# Patient Record
Sex: Female | Born: 1965 | Race: White | Hispanic: No | Marital: Married | Smoking: Never smoker
Health system: Southern US, Community
[De-identification: ages and names within clinical notes are randomized; demographics above are authoritative.]

## PROBLEM LIST (undated history)

## (undated) DIAGNOSIS — F32A Depression, unspecified: Secondary | ICD-10-CM

## (undated) DIAGNOSIS — K219 Gastro-esophageal reflux disease without esophagitis: Secondary | ICD-10-CM

## (undated) DIAGNOSIS — R32 Unspecified urinary incontinence: Secondary | ICD-10-CM

## (undated) DIAGNOSIS — S43006A Unspecified dislocation of unspecified shoulder joint, initial encounter: Secondary | ICD-10-CM

## (undated) DIAGNOSIS — J309 Allergic rhinitis, unspecified: Secondary | ICD-10-CM

## (undated) DIAGNOSIS — F329 Major depressive disorder, single episode, unspecified: Secondary | ICD-10-CM

## (undated) HISTORY — DX: Unspecified urinary incontinence: R32

## (undated) HISTORY — DX: Allergic rhinitis, unspecified: J30.9

## (undated) HISTORY — DX: Major depressive disorder, single episode, unspecified: F32.9

## (undated) HISTORY — DX: Unspecified dislocation of unspecified shoulder joint, initial encounter: S43.006A

## (undated) HISTORY — DX: Gastro-esophageal reflux disease without esophagitis: K21.9

## (undated) HISTORY — PX: APPENDECTOMY: SHX54

## (undated) HISTORY — DX: Depression, unspecified: F32.A

---

## 2005-03-02 ENCOUNTER — Other Ambulatory Visit: Admission: RE | Admit: 2005-03-02 | Discharge: 2005-03-02 | Payer: Self-pay | Admitting: Family Medicine

## 2005-04-01 ENCOUNTER — Encounter: Admission: RE | Admit: 2005-04-01 | Discharge: 2005-04-01 | Payer: Self-pay | Admitting: Family Medicine

## 2005-06-14 DIAGNOSIS — S43006A Unspecified dislocation of unspecified shoulder joint, initial encounter: Secondary | ICD-10-CM

## 2005-06-14 HISTORY — DX: Unspecified dislocation of unspecified shoulder joint, initial encounter: S43.006A

## 2005-06-14 HISTORY — PX: ELBOW SURGERY: SHX618

## 2006-01-25 ENCOUNTER — Ambulatory Visit: Payer: Self-pay | Admitting: Family Medicine

## 2006-03-01 ENCOUNTER — Encounter: Admission: RE | Admit: 2006-03-01 | Discharge: 2006-03-01 | Payer: Self-pay | Admitting: Family Medicine

## 2006-07-18 ENCOUNTER — Ambulatory Visit: Payer: Self-pay | Admitting: Family Medicine

## 2006-07-26 ENCOUNTER — Ambulatory Visit: Payer: Self-pay | Admitting: Family Medicine

## 2006-08-11 ENCOUNTER — Ambulatory Visit: Payer: Self-pay | Admitting: Family Medicine

## 2006-08-11 ENCOUNTER — Other Ambulatory Visit: Admission: RE | Admit: 2006-08-11 | Discharge: 2006-08-11 | Payer: Self-pay | Admitting: Family Medicine

## 2006-09-13 ENCOUNTER — Encounter: Admission: RE | Admit: 2006-09-13 | Discharge: 2006-09-13 | Payer: Self-pay | Admitting: Family Medicine

## 2006-09-29 ENCOUNTER — Ambulatory Visit: Payer: Self-pay | Admitting: Family Medicine

## 2007-04-20 ENCOUNTER — Ambulatory Visit: Payer: Self-pay | Admitting: Family Medicine

## 2007-05-24 ENCOUNTER — Ambulatory Visit: Payer: Self-pay | Admitting: Family Medicine

## 2007-08-11 ENCOUNTER — Ambulatory Visit: Payer: Self-pay | Admitting: Family Medicine

## 2007-08-11 ENCOUNTER — Other Ambulatory Visit: Admission: RE | Admit: 2007-08-11 | Discharge: 2007-08-11 | Payer: Self-pay | Admitting: Family Medicine

## 2008-02-29 IMAGING — CR DG HIP (WITH OR WITHOUT PELVIS) 2-3V*L*
2 series · 2 of 2 positions shown · non-contrast
Comparison: none

CLINICAL DATA: Left groin pain.  No injury.
 LEFT HIP ? 2 VIEW:

[view not recorded (1 of 2)]
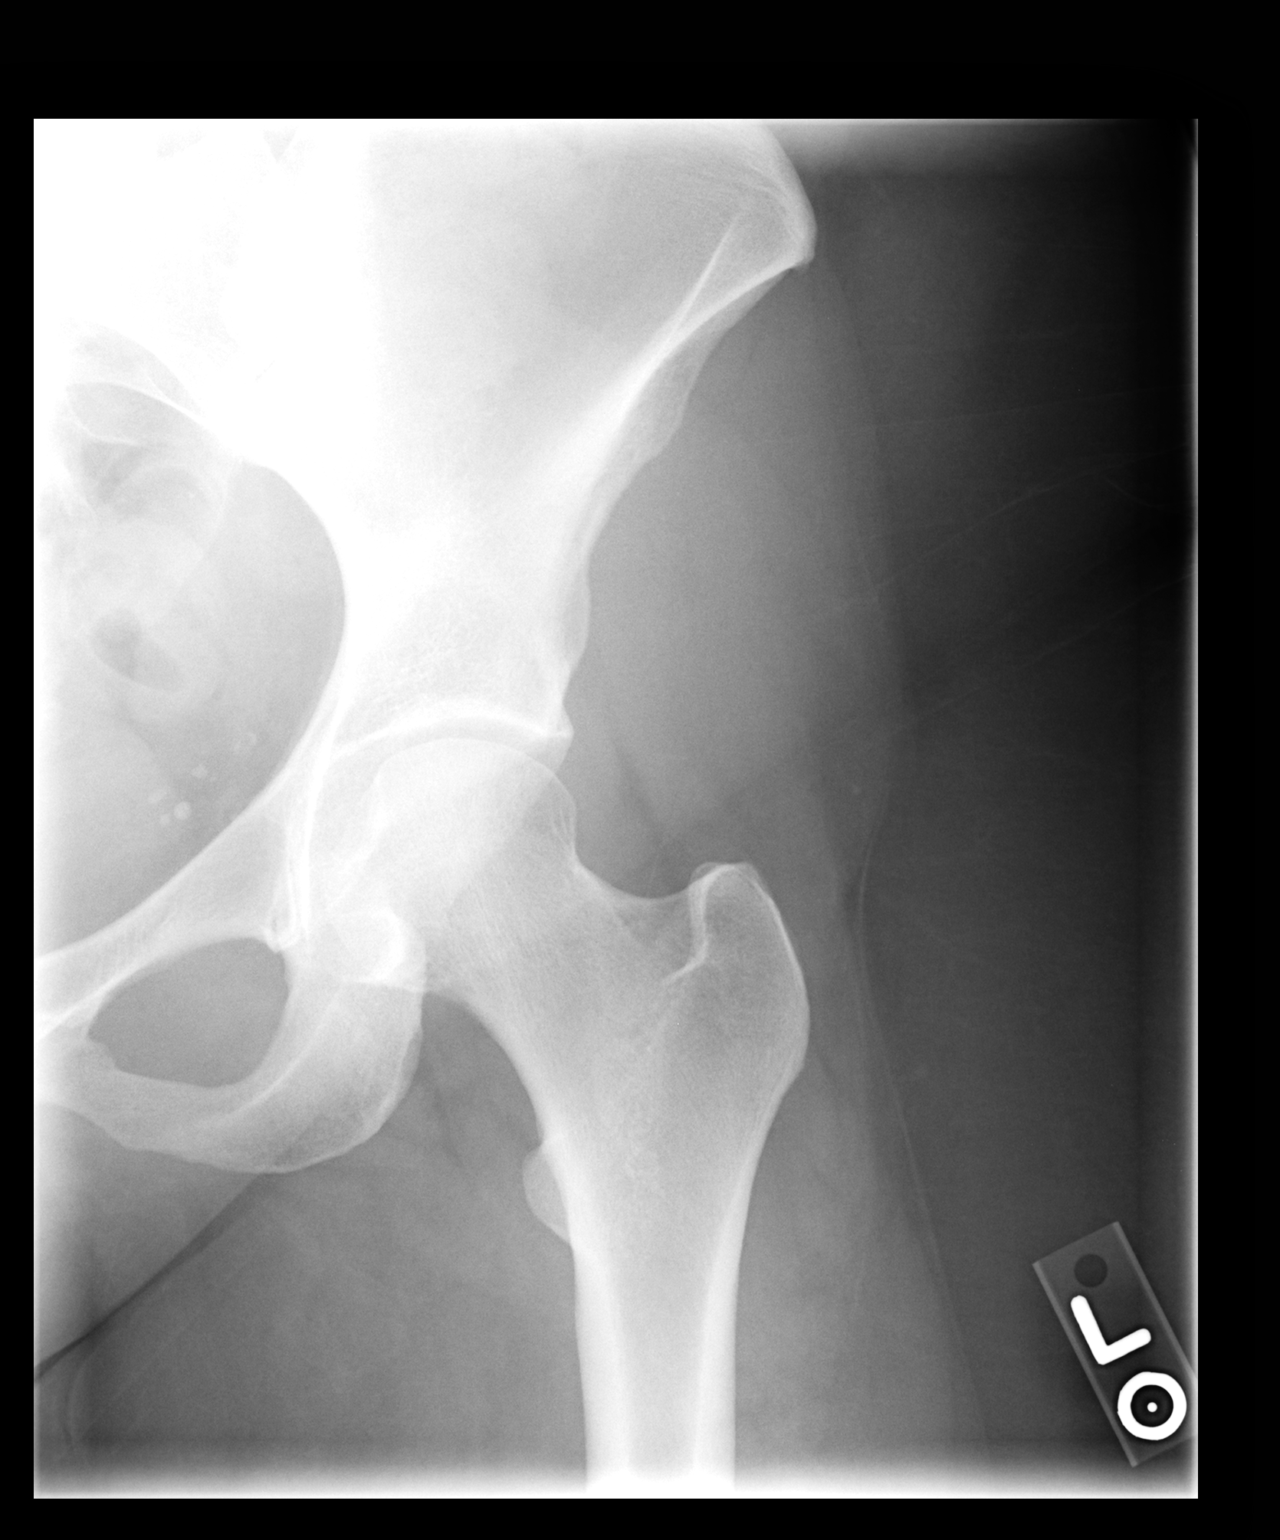

[view not recorded (2 of 2)]
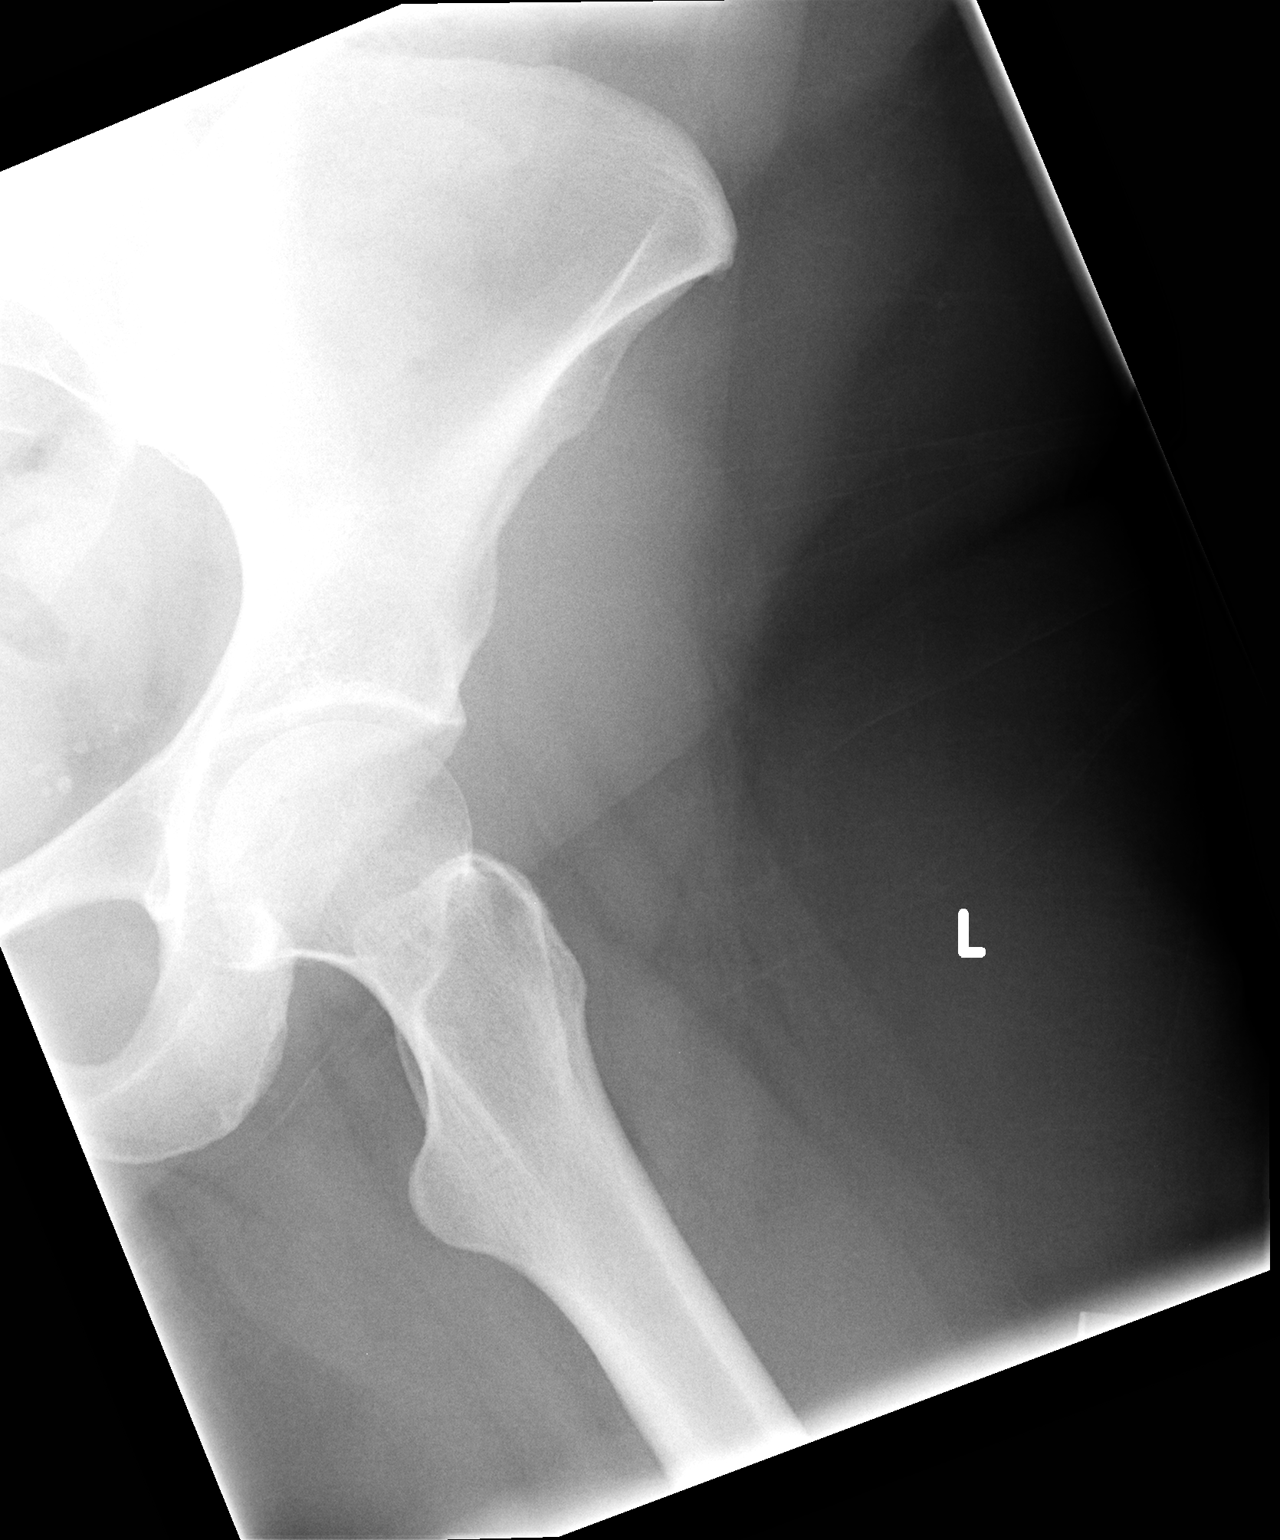

[2 of 2 positions shown; findings below may reference images not displayed]

FINDINGS: Two views of the left hip were obtained.  The left hip joint space appears normal.  No degenerative change is seen and no acute abnormality is noted.
IMPRESSION: Negative left hip.

## 2008-03-18 ENCOUNTER — Ambulatory Visit: Payer: Self-pay | Admitting: Family Medicine

## 2013-04-02 ENCOUNTER — Ambulatory Visit: Payer: Self-pay | Admitting: Neurology

## 2013-11-12 ENCOUNTER — Ambulatory Visit (INDEPENDENT_AMBULATORY_CARE_PROVIDER_SITE_OTHER): Payer: Self-pay | Admitting: Family Medicine

## 2013-11-12 ENCOUNTER — Ambulatory Visit: Payer: Self-pay | Admitting: Family Medicine

## 2013-11-12 ENCOUNTER — Encounter: Payer: Self-pay | Admitting: Family Medicine

## 2013-11-12 ENCOUNTER — Telehealth: Payer: Self-pay

## 2013-11-12 VITALS — BP 118/82 | HR 98 | Temp 98.4°F | Ht 63.5 in | Wt 196.0 lb

## 2013-11-12 DIAGNOSIS — M791 Myalgia, unspecified site: Secondary | ICD-10-CM

## 2013-11-12 DIAGNOSIS — R202 Paresthesia of skin: Secondary | ICD-10-CM

## 2013-11-12 DIAGNOSIS — E669 Obesity, unspecified: Secondary | ICD-10-CM | POA: Insufficient documentation

## 2013-11-12 DIAGNOSIS — M25519 Pain in unspecified shoulder: Secondary | ICD-10-CM

## 2013-11-12 DIAGNOSIS — R200 Anesthesia of skin: Secondary | ICD-10-CM

## 2013-11-12 DIAGNOSIS — R209 Unspecified disturbances of skin sensation: Secondary | ICD-10-CM

## 2013-11-12 DIAGNOSIS — IMO0001 Reserved for inherently not codable concepts without codable children: Secondary | ICD-10-CM

## 2013-11-12 DIAGNOSIS — M25512 Pain in left shoulder: Secondary | ICD-10-CM

## 2013-11-12 NOTE — Telephone Encounter (Signed)
Patient called to return your phone call °

## 2013-11-12 NOTE — Telephone Encounter (Signed)
Left message for call back Identifiable   New patient 

## 2013-11-12 NOTE — Progress Notes (Signed)
Subjective:    Patient ID: Cindy Stephens, female    DOB: September 09, 1965, 48 y.o.   MRN: [id number]  HPI Pt here to establish and c/o L shoulder pain and numbness in R arm and legs.  April 20 she had an episode of dizziness and she laid down and lifted her arm up an felt a pop in L shoulder.  Swelling went away but she she still has a nagging discomfort in L shoulder.  And she has hx of dislocation year s ago.  No chest pain. Sob .  When she lays down at night her L shoulder hurts.  Pt also c/o numb left thigh.  No back problems.     Review of Systems As above  Past Medical History  Diagnosis Date  . Shoulder dislocation 2007    Left Shoulder  . Depression   . GERD (gastroesophageal reflux disease)   . Allergic rhinitis   . Urinary incontinence    History   Social History  . Marital Status: Married    Spouse Name: N/A    Number of Children: N/A  . Years of Education: N/A   Occupational History  . Not on file.   Social History Main Topics  . Smoking status: Never Smoker   . Smokeless tobacco: Never Used  . Alcohol Use: Yes     Comment: Occ  . Drug Use: No  . Sexual Activity: Yes    Partners: Male   Other Topics Concern  . Not on file   Social History Narrative  . No narrative on file   Current Outpatient Prescriptions  Medication Sig Dispense Refill  . aspirin 81 MG tablet Take 81 mg by mouth daily.      Marland Kitchen co-enzyme Q-10 30 MG capsule Take 30 mg by mouth 3 (three) times daily.      Marland Kitchen DHEA 10 MG TABS Take by mouth.      . fluticasone (FLONASE) 50 MCG/ACT nasal spray Place into both nostrils daily.      Marland Kitchen glucosamine-chondroitin 500-400 MG tablet Take 1 tablet by mouth 2 (two) times daily.      Marland Kitchen loratadine (CLARITIN) 10 MG tablet Take 10 mg by mouth daily.      . Melatonin 10 MG CAPS Take by mouth.      . Misc Natural Products (ESTROVEN ENERGY) TABS Take 1 tablet by mouth 2 (two) times daily.      . montelukast (SINGULAIR) 10 MG tablet Take 10 mg by mouth at bedtime.       . Omega 3-6-9 Fatty Acids (OMEGA 3-6-9 COMPLEX PO) Take by mouth.      . Omega-3 Fatty Acids (FISH OIL) 1000 MG CAPS Take 1 capsule by mouth.      Marland Kitchen omeprazole (PRILOSEC OTC) 20 MG tablet Take 20 mg by mouth daily.      Marland Kitchen Phenylephrine-DM-GG-APAP (MUCINEX FAST-MAX) 10-20-400-650 MG PACK Take by mouth.      . pseudoephedrine (SUDAFED) 30 MG tablet Take 30 mg by mouth every 4 (four) hours as needed for congestion.      . vitamin B-12 (CYANOCOBALAMIN) 1000 MCG tablet Take 1,000 mcg by mouth daily.       No current facility-administered medications for this visit.       Objective:   Physical Exam        Assessment & Plan:  1. Numbness and tingling Check labs , consider neuro secondary to multiple limb involvment - Basic metabolic panel - CBC with Differential -  Hepatic function panel - POCT urinalysis dipstick - TSH - Vitamin B12 - Vitamin D 1,25 dihydroxy - ANA - Sedimentation rate - Rheumatoid factor - AMB referral to sports medicine - T3, free - T4, free - Thyroid antibodies  2. Myalgia Check labs--hx vita d def - Basic metabolic panel - CBC with Differential - Hepatic function panel - POCT urinalysis dipstick - TSH - Vitamin B12 - Vitamin D 1,25 dihydroxy - ANA - Sedimentation rate - Rheumatoid factor - T3, free - T4, free - Thyroid antibodies  3. Left shoulder pain ekg nsr Refer to sports med for eval  - EKG 12-Lead - AMB referral to sports medicine - T3, free - T4, free - Thyroid antibodies

## 2013-11-12 NOTE — Progress Notes (Signed)
Pre visit review using our clinic review tool, if applicable. No additional management support is needed unless otherwise documented below in the visit note. 

## 2013-11-12 NOTE — Telephone Encounter (Signed)
Unable to reach pre visit.  

## 2013-11-12 NOTE — Patient Instructions (Signed)
   Shoulder Pain The shoulder is the joint that connects your arms to your body. The bones that form the shoulder joint include the upper arm bone (humerus), the shoulder blade (scapula), and the collarbone (clavicle). The top of the humerus is shaped like a ball and fits into a rather flat socket on the scapula (glenoid cavity). A combination of muscles and strong, fibrous tissues that connect muscles to bones (tendons) support your shoulder joint and hold the ball in the socket. Small, fluid-filled sacs (bursae) are located in different areas of the joint. They act as cushions between the bones and the overlying soft tissues and help reduce friction between the gliding tendons and the bone as you move your arm. Your shoulder joint allows a wide range of motion in your arm. This range of motion allows you to do things like scratch your back or throw a ball. However, this range of motion also makes your shoulder more prone to pain from overuse and injury. Causes of shoulder pain can originate from both injury and overuse and usually can be grouped in the following four categories:  Redness, swelling, and pain (inflammation) of the tendon (tendinitis) or the bursae (bursitis).  Instability, such as a dislocation of the joint.  Inflammation of the joint (arthritis).  Broken bone (fracture). HOME CARE INSTRUCTIONS   Apply ice to the sore area.  Put ice in a plastic bag.  Place a towel between your skin and the bag.  Leave the ice on for 15-20 minutes, 03-04 times per day for the first 2 days.  Stop using cold packs if they do not help with the pain.  If you have a shoulder sling or immobilizer, wear it as long as your caregiver instructs. Only remove it to shower or bathe. Move your arm as little as possible, but keep your hand moving to prevent swelling.  Squeeze a soft ball or foam pad as much as possible to help prevent swelling.  Only take over-the-counter or prescription medicines for  pain, discomfort, or fever as directed by your caregiver. SEEK MEDICAL CARE IF:   Your shoulder pain increases, or new pain develops in your arm, hand, or fingers.  Your hand or fingers become cold and numb.  Your pain is not relieved with medicines. SEEK IMMEDIATE MEDICAL CARE IF:   Your arm, hand, or fingers are numb or tingling.  Your arm, hand, or fingers are significantly swollen or turn white or blue. MAKE SURE YOU:   Understand these instructions.  Will watch your condition.  Will get help right away if you are not doing well or get worse. Document Released: 03/10/2005 Document Revised: 02/23/2012 Document Reviewed: 05/15/2011 [employer] Patient Information 2014 [employer].  

## 2013-11-13 ENCOUNTER — Other Ambulatory Visit (INDEPENDENT_AMBULATORY_CARE_PROVIDER_SITE_OTHER): Payer: Self-pay

## 2013-11-13 DIAGNOSIS — Z0000 Encounter for general adult medical examination without abnormal findings: Secondary | ICD-10-CM

## 2013-11-13 LAB — HEPATIC FUNCTION PANEL
ALT: 25 U/L (ref 0–35)
AST: 24 U/L (ref 0–37)
Albumin: 4 g/dL (ref 3.5–5.2)
Alkaline Phosphatase: 55 U/L (ref 39–117)
Bilirubin, Direct: 0 mg/dL (ref 0.0–0.3)
Total Bilirubin: 0.8 mg/dL (ref 0.2–1.2)
Total Protein: 6.9 g/dL (ref 6.0–8.3)

## 2013-11-13 LAB — CBC WITH DIFFERENTIAL/PLATELET
Basophils Absolute: 0 10*3/uL (ref 0.0–0.1)
Basophils Relative: 0.6 % (ref 0.0–3.0)
Eosinophils Absolute: 0.1 10*3/uL (ref 0.0–0.7)
Eosinophils Relative: 2.6 % (ref 0.0–5.0)
HCT: 36.4 % (ref 36.0–46.0)
Hemoglobin: 12.6 g/dL (ref 12.0–15.0)
Lymphocytes Relative: 33.9 % (ref 12.0–46.0)
Lymphs Abs: 1.4 10*3/uL (ref 0.7–4.0)
MCHC: 34.6 g/dL (ref 30.0–36.0)
MCV: 89.5 fl (ref 78.0–100.0)
Monocytes Absolute: 0.3 10*3/uL (ref 0.1–1.0)
Monocytes Relative: 7.7 % (ref 3.0–12.0)
Neutro Abs: 2.2 10*3/uL (ref 1.4–7.7)
Neutrophils Relative %: 55.2 % (ref 43.0–77.0)
Platelets: 188 10*3/uL (ref 150.0–400.0)
RBC: 4.06 Mil/uL (ref 3.87–5.11)
RDW: 13.2 % (ref 11.5–15.5)
WBC: 4 10*3/uL (ref 4.0–10.5)

## 2013-11-13 LAB — BASIC METABOLIC PANEL
BUN: 15 mg/dL (ref 6–23)
CO2: 25 mEq/L (ref 19–32)
Calcium: 9.1 mg/dL (ref 8.4–10.5)
Chloride: 103 mEq/L (ref 96–112)
Creatinine, Ser: 0.7 mg/dL (ref 0.4–1.2)
GFR: 99.48 mL/min (ref >60.00)
Glucose, Bld: 103 mg/dL — ABNORMAL HIGH (ref 70–99)
Potassium: 3.6 mEq/L (ref 3.5–5.1)
Sodium: 136 mEq/L (ref 135–145)

## 2013-11-13 LAB — ANTI-NUCLEAR AB-TITER (ANA TITER): ANA Titer 1: 1:80 {titer} — ABNORMAL HIGH

## 2013-11-13 LAB — THYROID ANTIBODIES
Thyroglobulin Ab: 26.6 IU/mL (ref <40.0)
Thyroperoxidase Ab SerPl-aCnc: <10 IU/mL (ref <35.0)

## 2013-11-13 LAB — LIPID PANEL
Cholesterol: 217 mg/dL — ABNORMAL HIGH (ref 0–200)
HDL: 43.5 mg/dL (ref >39.00)
LDL Cholesterol: 137 mg/dL — ABNORMAL HIGH (ref 0–99)
NonHDL: 173.5
Total CHOL/HDL Ratio: 5
Triglycerides: 182 mg/dL — ABNORMAL HIGH (ref 0.0–149.0)
VLDL: 36.4 mg/dL (ref 0.0–40.0)

## 2013-11-13 LAB — SEDIMENTATION RATE: Sed Rate: 6 mm/hr (ref 0–22)

## 2013-11-13 LAB — RHEUMATOID FACTOR: Rheumatoid fact SerPl-aCnc: <10 IU/mL (ref <=14)

## 2013-11-13 LAB — T4, FREE: Free T4: 0.66 ng/dL (ref 0.60–1.60)

## 2013-11-13 LAB — TSH: TSH: 1.45 u[IU]/mL (ref 0.35–4.50)

## 2013-11-13 LAB — VITAMIN B12: Vitamin B-12: 580 pg/mL (ref 211–911)

## 2013-11-13 LAB — T3, FREE: T3, Free: 2.6 pg/mL (ref 2.3–4.2)

## 2013-11-13 LAB — ANA: Anti Nuclear Antibody(ANA): POSITIVE — AB

## 2013-11-14 ENCOUNTER — Other Ambulatory Visit (INDEPENDENT_AMBULATORY_CARE_PROVIDER_SITE_OTHER): Payer: Self-pay

## 2013-11-14 ENCOUNTER — Ambulatory Visit (INDEPENDENT_AMBULATORY_CARE_PROVIDER_SITE_OTHER): Payer: Self-pay | Admitting: Family Medicine

## 2013-11-14 ENCOUNTER — Encounter: Payer: Self-pay | Admitting: Family Medicine

## 2013-11-14 VITALS — BP 116/80 | HR 82 | Ht 63.5 in | Wt 192.0 lb

## 2013-11-14 DIAGNOSIS — M25519 Pain in unspecified shoulder: Secondary | ICD-10-CM

## 2013-11-14 DIAGNOSIS — M25512 Pain in left shoulder: Secondary | ICD-10-CM

## 2013-11-14 DIAGNOSIS — M7552 Bursitis of left shoulder: Secondary | ICD-10-CM

## 2013-11-14 DIAGNOSIS — M719 Bursopathy, unspecified: Secondary | ICD-10-CM

## 2013-11-14 DIAGNOSIS — M67919 Unspecified disorder of synovium and tendon, unspecified shoulder: Secondary | ICD-10-CM

## 2013-11-14 DIAGNOSIS — R200 Anesthesia of skin: Secondary | ICD-10-CM | POA: Insufficient documentation

## 2013-11-14 DIAGNOSIS — R209 Unspecified disturbances of skin sensation: Secondary | ICD-10-CM

## 2013-11-14 LAB — POCT URINALYSIS DIPSTICK
Bilirubin, UA: NEGATIVE
Blood, UA: NEGATIVE
Glucose, UA: NEGATIVE
Ketones, UA: NEGATIVE
Leukocytes, UA: NEGATIVE
Nitrite, UA: NEGATIVE
Protein, UA: NEGATIVE
Spec Grav, UA: 1.02
Urobilinogen, UA: 0.2
pH, UA: 6

## 2013-11-14 NOTE — Progress Notes (Signed)
[name] [address] [place], [place] [address] Phone: [phone] Subjective:    I'm seeing this patient by the request  of:  Loreen Freud, DO   CC: Shoulder pain  IBB:[id number] Cindy Stephens is a 48 y.o. female coming in with complaint of left shoulder pain. Patient states she has had this pain for multiple years. Patient did have a history of a shoulder dislocation back in 2007 after a fall. Patient unfortunately had to have elbow surgery. Patient states since that time she can have some mild discomfort over the course last several months it seems to be worsening. Patient states it is a dull aching sensation with some mild radiation down her arm that can cause numbness. Patient states that she did have a numbness in her leg as well as her other hand from time to time. She does not know if this is related or not. Patient states certain movements can cause some mild discomfort but is still able to do a lot including all activities of daily living. Denies any nighttime awakening but states that the severity is 6/10. Patient is taking Advil on a regular basis without any significant improvement.     Past medical history, social, surgical and family history all reviewed in electronic medical record.   Review of Systems: No headache, visual changes, nausea, vomiting, diarrhea, constipation, dizziness, abdominal pain, skin rash, fevers, chills, night sweats, weight loss, swollen lymph nodes, body aches, joint swelling, muscle aches, chest pain, shortness of breath, mood changes.   Objective Blood pressure 116/80, pulse 82, height 5' 3.5" (1.613 m), weight 192 lb (87.091 kg), last menstrual period 11/02/2013, SpO2 97.00%.  General: No apparent distress alert and oriented x3 mood and affect normal, dressed appropriately. Patient though is anxious HEENT: Pupils equal, extraocular movements intact  Respiratory: Patient's speak in full sentences and does not appear  short of breath  Cardiovascular: No lower extremity edema, non tender, no erythema  Skin: Warm dry intact with no signs of infection or rash on extremities or on axial skeleton.  Abdomen: Soft nontender  Neuro: Cranial nerves II through XII are intact, neurovascularly intact in all extremities with 2+ DTRs and 2+ pulses.  Lymph: No lymphadenopathy of posterior or anterior cervical chain or axillae bilaterally.  Gait normal with good balance and coordination.  MSK:  Non tender with full range of motion and good stability and symmetric strength and tone of  elbows, wrist, hip, knee and ankles bilaterally. Surgical scar on left elbow full range of motion. Shoulder: left Inspection reveals no abnormalities, atrophy or asymmetry. Palpation is normal with no tenderness over AC joint or bicipital groove. ROM is full in all planes passively. Rotator cuff strength normal throughout. signs of impingement with positive Neer and Hawkin's tests, but negative empty can sign. Speeds and Yergason's tests normal. No labral pathology noted with negative Obrien's, negative clunk and good stability. Normal scapular function observed. No painful arc and no drop arm sign. No apprehension sign  MSK US performed of: left This study was ordered, performed, and interpreted by Terrilee Files D.O.  Shoulder:   Supraspinatus:  Appears normal on long and transverse views, Bursal bulge seen with shoulder abduction on impingement view. Infraspinatus:  Appears normal on long and transverse views. Significant increase in Doppler flow Subscapularis:  Appears normal on long and transverse views. Positive bursa Teres Minor:  Appears normal on long and transverse views. AC joint:  Capsule undistended, no geyser sign. Glenohumeral Joint:  Appears normal without effusion. Glenoid Labrum:  Intact without visualized tears. Biceps Tendon:  Appears normal on long and transverse views, no fraying of tendon, tendon located in  intertubercular groove, no subluxation with shoulder internal or external rotation.  Impression: Subacromial bursitis  Procedure: Real-time Ultrasound Guided Injection of left glenohumeral joint Device: GE Logiq E  Ultrasound guided injection is preferred based studies that show increased duration, increased effect, greater accuracy, decreased procedural pain, increased response rate with ultrasound guided versus blind injection.  Verbal informed consent obtained.  Time-out conducted.  Noted no overlying erythema, induration, or other signs of local infection.  Skin prepped in a sterile fashion.  Local anesthesia: Topical Ethyl chloride.  With sterile technique and under real time ultrasound guidance:  Joint visualized.  23g 1  inch needle inserted posterior approach. Pictures taken for needle placement. Patient did have injection of 2 cc of 1% lidocaine, 2 cc of 0.5% Marcaine, and 1.0 cc of Kenalog 40 mg/dL. Completed without difficulty  Pain immediately resolved suggesting accurate placement of the medication.  Advised to call if fevers/chills, erythema, induration, drainage, or persistent bleeding.  Images permanently stored and available for review in the ultrasound unit.  Impression: Technically successful ultrasound guided injection.     Impression and Recommendations:     This case required medical decision making of moderate complexity.

## 2013-11-14 NOTE — Assessment & Plan Note (Signed)
Patient did not have any numbness on exam today. We discussed different possibilities of this including iron deficiency, and other nutritional components that could play a role. Patient is going to try supplementation and see if this makes any improvement. Patient continues to have trouble we may need to consider further workup with labs and potentially imaging. Patient does have significant anxiety think is likely more contributing to her difficulties. Patient did have a positive ANA but only weakly positive with tighter level. Do not feel to strongly that this is autoimmune but will need to keep and differential.

## 2013-11-14 NOTE — Assessment & Plan Note (Signed)
Patient did tolerate the procedure well and had good relief. Patient was given home exercise program specifically for the the shoulder. Patient did bring up lateral hip pain while she was walking up the door and patient was given an exercise program for this as well. Patient did not want any medications at this time and did give a recommendation over-the-counter medicines that could be beneficial. We discussed icing protocol as well. We discussed formal physical therapy which patient declined secondary to financial restraints. The patient is a try these interventions and come back again in 3 weeks for further evaluation.

## 2013-11-14 NOTE — Patient Instructions (Signed)
Very nice to meet you Cindy Stephens 20 minutes 2 times a day Exercises 3 times a week.  Glucosamine sulfate 750mg  twice a day is a supplement that has been shown to help moderate to severe arthritis. Vitamin D 2000 IU daily Fish oil 2 grams daily.  Tumeric 500mg  twice daily.  Capsaicin topically up to four times a day may also help with pain. It's important that you continue to stay active. Controlling your weight is important.  Consider physical therapy to strengthen muscles around the joint that hurts to take pressure off of the joint itself. Wear good shoes.  Lelon Frohlich, Dansko are my favorite.  Water aerobics and cycling with low resistance are the best two types of exercise for arthritis. Come back and see me in 4 weeks.

## 2013-11-15 LAB — VITAMIN D 1,25 DIHYDROXY
Vitamin D 1, 25 (OH)2 Total: 65 pg/mL (ref 18–72)
Vitamin D2 1, 25 (OH)2: <8 pg/mL
Vitamin D3 1, 25 (OH)2: 65 pg/mL

## 2013-11-16 ENCOUNTER — Telehealth: Payer: Self-pay

## 2013-11-16 DIAGNOSIS — R768 Other specified abnormal immunological findings in serum: Secondary | ICD-10-CM

## 2013-11-16 NOTE — Telephone Encounter (Signed)
Spoke with patient and she voiced understanding, she has question about the ANA and her symptoms and I made hew aware that I was unaware of the cause of the ANA being elevated and this is the reason for the referral. She voiced understading.     KP

## 2013-11-16 NOTE — Telephone Encounter (Signed)
Message copied by Arnette Norris on Fri Nov 16, 2013 11:07 AM ------      Message from: Lelon Perla      Created: Wed Nov 14, 2013  7:34 AM       Ana positive-- refer to rheum ------

## 2014-01-14 ENCOUNTER — Ambulatory Visit: Payer: Self-pay | Admitting: Family Medicine

## 2014-01-22 ENCOUNTER — Telehealth: Payer: Self-pay | Admitting: Family Medicine

## 2014-01-22 NOTE — Telephone Encounter (Signed)
Patient Information:  Caller Name: Adrain  Phone: [phone]  Patient: Cindy Stephens, Cindy Stephens  Gender: Female  DOB: Oct 12, 1965  Age: 48 Years  PCP: Lelon PerlaLowne, Yvonne R.  Pregnant: No  Office Follow Up:  Does the office need to follow up with this patient?: Yes  Instructions For The Office: Has sleep issues since childhood, only sleeps 4-5 hours, obese and snores heavily per her husband, malaise and aches. Requesting appointment next week to address these issues.  RN Note:  Afebrile. OV follow up from 11/12/2013. Onset of the last few years of not feeling well: sleeping only 4-5 hours/night. Next appointment is with the Rheumatologist 02/20/2014, went 01/02/2014, was not seen due to a scheduling error. Overall she has had malaise, soreness/achey especially in the left leg and foot, making it difficult to ambulate, cannot sleep (her father had sleep apnea and her husband says she snores a lot/obesity). Her father died last year and her mother gave her her father's Ambien 12mg . She did get to sleep and had no side effects, has not taken since January 2015. She is concerned about her weight, sleep issues and overall malaise. RN/CAN asked if she ever had sleep study, she has not. RN/CAN called office as she is needing to cancel 01/24/2014 appointment (RN/CAN does not see it listed in EPIC to cancel, she thinks they may have done it automatically since she called in yesterday, 01/21/2014). Please call today, 01/22/2014 to schedule an appointment for anytime next week, preferrably starting Tuesdday, 01/29/2014 to manage sleep and malaise.  Symptoms  Reason For Call & Symptoms: OV in June 2015  Reviewed Health History In EMR: Yes  Reviewed Medications In EMR: Yes  Reviewed Allergies In EMR: Yes  Reviewed Surgeries / Procedures: Yes  Date of Onset of Symptoms: 01/22/2014 OB / GYN:  LMP: Unknown  Guideline(s) Used:  No Protocol Available - Sick Adult  Disposition Per Guideline:   Discuss with PCP and Callback by Nurse  Today  Reason For Disposition Reached:   Nursing judgment  Advice Given:  N/A  Patient Will Follow Care Advice:  YES

## 2014-01-22 NOTE — Telephone Encounter (Signed)
Spoke with patient who states that she is having general feelings of malaise and problems with her feet. Scheduled patient for appt 8/20 with PCP. Advised that her CPE is 9/22.

## 2014-01-23 ENCOUNTER — Encounter: Payer: Self-pay | Admitting: Family Medicine

## 2014-01-24 ENCOUNTER — Ambulatory Visit: Payer: Self-pay | Admitting: Family Medicine

## 2014-01-31 ENCOUNTER — Ambulatory Visit (INDEPENDENT_AMBULATORY_CARE_PROVIDER_SITE_OTHER): Payer: Self-pay | Admitting: Family Medicine

## 2014-01-31 ENCOUNTER — Encounter: Payer: Self-pay | Admitting: Family Medicine

## 2014-01-31 VITALS — BP 116/74 | HR 88 | Temp 98.0°F | Wt 193.0 lb

## 2014-01-31 DIAGNOSIS — E669 Obesity, unspecified: Secondary | ICD-10-CM

## 2014-01-31 DIAGNOSIS — M79672 Pain in left foot: Secondary | ICD-10-CM

## 2014-01-31 DIAGNOSIS — M79609 Pain in unspecified limb: Secondary | ICD-10-CM

## 2014-01-31 MED ORDER — PHENTERMINE HCL 37.5 MG PO TABS: 37.5000 mg | ORAL_TABLET | Freq: Every day | ORAL | Status: DC

## 2014-01-31 NOTE — Progress Notes (Signed)
Pre visit review using our clinic review tool, if applicable. No additional management support is needed unless otherwise documented below in the visit note. 

## 2014-01-31 NOTE — Progress Notes (Signed)
   Subjective:    Patient ID: Cindy Stephens, female    DOB: March 26, 1966, 48 y.o.   MRN: [id number]  HPI Pt here c/o L foot pain --lat heel.  No known injury.  Hurts all the time.  Some pain on bottom of heel.   Pt also struggling to lose weight.  She is having trouble exercising secondary to heel pain.  She was on adipex years ago and it was successful   Review of Systems As above    Objective:   Physical Exam BP 116/74  Pulse 88  Temp(Src) 98 F (36.7 C) (Oral)  Wt 193 lb (87.544 kg)  SpO2 97% General appearance: alert, cooperative, appears stated age and no distress Neck: no adenopathy, supple, symmetrical, trachea midline and thyroid not enlarged, symmetric, no tenderness/mass/nodules Lungs: clear to auscultation bilaterally Heart: S1, S2 normal Extremities: extremities normal, atraumatic, no cyanosis or edema          1. Obesity (BMI 30-39.9) Diet and exercise - phentermine (ADIPEX-P) 37.5 MG tablet; Take 1 tablet (37.5 mg total) by mouth daily before breakfast.  Dispense: 30 tablet; Refill: 0 rto 1 month 2. Left foot pain aso--- and f/u sport med - Ambulatory referral to Sports Medicine

## 2014-01-31 NOTE — Patient Instructions (Signed)

## 2014-02-12 ENCOUNTER — Other Ambulatory Visit (INDEPENDENT_AMBULATORY_CARE_PROVIDER_SITE_OTHER): Payer: Self-pay

## 2014-02-12 ENCOUNTER — Ambulatory Visit (INDEPENDENT_AMBULATORY_CARE_PROVIDER_SITE_OTHER): Payer: Self-pay | Admitting: Family Medicine

## 2014-02-12 ENCOUNTER — Encounter: Payer: Self-pay | Admitting: Family Medicine

## 2014-02-12 VITALS — BP 118/80 | HR 94 | Ht 64.0 in | Wt 190.0 lb

## 2014-02-12 DIAGNOSIS — M7672 Peroneal tendinitis, left leg: Secondary | ICD-10-CM | POA: Insufficient documentation

## 2014-02-12 DIAGNOSIS — M79609 Pain in unspecified limb: Secondary | ICD-10-CM

## 2014-02-12 DIAGNOSIS — M79672 Pain in left foot: Secondary | ICD-10-CM

## 2014-02-12 DIAGNOSIS — M7750 Other enthesopathy of unspecified foot: Secondary | ICD-10-CM

## 2014-02-12 MED ORDER — DICLOFENAC SODIUM 2 % TD SOLN: TRANSDERMAL | Status: DC

## 2014-02-12 NOTE — Assessment & Plan Note (Signed)
Patient does have peroneal tendinitis overall. Patient differential also includes Achilles tendinitis but on ultrasound this is unremarkable. Patient also has a quadratus peroneal tendon that is likely also contributing to her discomfort. No subluxation was noted today. We discussed bracing, icing, and topical anti-inflammatories which were prescribed today. Patient will do strengthening exercises and come back and see me again in 3-4 weeks. If continued to have pain we'll consider a intra-tendon sheath injection.  Spent greater than 25 minutes with patient face-to-face and had greater than 50% of counseling including as described above in assessment and plan.

## 2014-02-12 NOTE — Patient Instructions (Addendum)
It is good to see you Ice bath 20 minutes at end of the day.  Exercises 3 times a week.  Try the pennsaid 2 times daily [employer] look for x-linked ankle sleeve size medium.  Consider  of iron at night.  Water 2 cups right when you wake up.  Hand full of nuts or something within 10 minutes.  When working out then eat within 30 minutes.  Never go longer then 2 hours without eating.  No carbs after 6 pm.  Resistance training 2 times a week.  Come back again in 3-4 weeks.

## 2014-02-12 NOTE — Progress Notes (Signed)
[name] [address] [place], [place] [address] Phone: [phone] Subjective:    I'm seeing this patient by the request  of:  Loreen Freud, DO   CC: Foot pain, left  [id number] Cindy Stephens is a 48 y.o. female coming in with complaint of foot pain. Patient states that most of her pain seems to be located on the lateral aspect of her heel. Patient states that she can have some pain on the bottom of the heel as well. Seems to hurt all the time but is worse with weightbearing. Patient describes the pain as dull aching pain that is worse with walking. Patient denies any numbness. States that it's all on the lateral aspect of her foot ankle as well. States that this is stuck in her from trying to lose weight. Patient is becoming very frustrated. Patient states that the pain does respond moderately to over-the-counter medications. Patient is taking ibuprofen fairly regularly. Great the severity of 7/10      Past medical history, social, surgical and family history all reviewed in electronic medical record.   Review of Systems: No headache, visual changes, nausea, vomiting, diarrhea, constipation, dizziness, abdominal pain, skin rash, fevers, chills, night sweats, weight loss, swollen lymph nodes, body aches, joint swelling, muscle aches, chest pain, shortness of breath, mood changes.   Objective Blood pressure 118/80, pulse 94, height  (1.626 m), weight 190 lb (86.183 kg), SpO2 96.00%.  General: No apparent distress alert and oriented x3 mood and affect normal, dressed appropriately. Overweight HEENT: Pupils equal, extraocular movements intact  Respiratory: Patient's speak in full sentences and does not appear short of breath  Cardiovascular: No lower extremity edema, non tender, no erythema  Skin: Warm dry intact with no signs of infection or rash on extremities or on axial skeleton.  Abdomen: Soft nontender  Neuro: Cranial nerves II through XII  are intact, neurovascularly intact in all extremities with 2+ DTRs and 2+ pulses.  Lymph: No lymphadenopathy of posterior or anterior cervical chain or axillae bilaterally.  Gait mild antalgic gait  MSK:  Non tender with full range of motion and good stability and symmetric strength and tone of shoulders, elbows, wrist, hip, knee and bilaterally.  Ankle: Left No visible erythema. Range of motion is full in all directions. Strength is 5/5 in all directions. Stable lateral and medial ligaments; squeeze test and kleiger test unremarkable; Talar dome nontender; No pain at base of 5th MT; No tenderness over cuboid; No tenderness over N spot or navicular prominence No tenderness on posterior aspects of lateral and medial malleolus Tender to palpation over the peroneal tendons mild swelling noted in this area. No subluxation noted Able to walk 4 steps. Contralateral ankle unremarkable  MSK US performed of: Left ankle This study was ordered, performed, and interpreted by Terrilee Files D.O.  Foot/Ankle:   All structures visualized.   Talar dome unremarkable  Ankle mortise with mild effusion. Peroneus longus and brevis tendons to have significant hypoechoic changes. In addition patient also has a peroneal quadratus noted Posterior tibialis, flexor hallucis longus, and flexor digitorum longus tendons unremarkable on long and transverse views without sheath effusions. Achilles tendon visualized along length of tendon and unremarkable on long and transverse views without sheath effusion. Anterior Talofibular Ligament and Calcaneofibular Ligaments unremarkable and intact. Deltoid Ligament unremarkable and intact. Plantar fascia intact and without effusion, normal thickness. No increased doppler signal, cap sign, or thickening of tibial cortex. Power doppler signal normal.  IMPRESSION:  Peroneal tendinitis with peroneal quadratus      Impression and Recommendations:     This case required  medical decision making of moderate complexity.

## 2014-03-05 ENCOUNTER — Encounter: Payer: Self-pay | Admitting: Family Medicine

## 2014-03-05 ENCOUNTER — Ambulatory Visit (INDEPENDENT_AMBULATORY_CARE_PROVIDER_SITE_OTHER): Payer: Self-pay | Admitting: Family Medicine

## 2014-03-05 ENCOUNTER — Telehealth: Payer: Self-pay | Admitting: Family Medicine

## 2014-03-05 ENCOUNTER — Other Ambulatory Visit (HOSPITAL_COMMUNITY)
Admission: RE | Admit: 2014-03-05 | Discharge: 2014-03-05 | Disposition: A | Discharge status: Home / Self Care | Payer: Self-pay | Source: Ambulatory Visit | Attending: Family Medicine | Admitting: Family Medicine

## 2014-03-05 VITALS — BP 112/62 | HR 97 | Temp 98.8°F | Ht 63.75 in | Wt 187.6 lb

## 2014-03-05 DIAGNOSIS — E663 Overweight: Secondary | ICD-10-CM

## 2014-03-05 DIAGNOSIS — Z0000 Encounter for general adult medical examination without abnormal findings: Secondary | ICD-10-CM

## 2014-03-05 DIAGNOSIS — Z808 Family history of malignant neoplasm of other organs or systems: Secondary | ICD-10-CM

## 2014-03-05 DIAGNOSIS — R209 Unspecified disturbances of skin sensation: Secondary | ICD-10-CM

## 2014-03-05 DIAGNOSIS — R200 Anesthesia of skin: Secondary | ICD-10-CM

## 2014-03-05 DIAGNOSIS — Z01419 Encounter for gynecological examination (general) (routine) without abnormal findings: Secondary | ICD-10-CM | POA: Insufficient documentation

## 2014-03-05 DIAGNOSIS — Z1151 Encounter for screening for human papillomavirus (HPV): Secondary | ICD-10-CM | POA: Insufficient documentation

## 2014-03-05 DIAGNOSIS — Z124 Encounter for screening for malignant neoplasm of cervix: Secondary | ICD-10-CM

## 2014-03-05 LAB — BASIC METABOLIC PANEL
BUN: 18 mg/dL (ref 6–23)
CO2: 27 mEq/L (ref 19–32)
CREATININE: 0.8 mg/dL (ref 0.4–1.2)
Calcium: 9.3 mg/dL (ref 8.4–10.5)
Chloride: 102 mEq/L (ref 96–112)
GFR: 76.53 mL/min (ref >60.00)
Glucose, Bld: 107 mg/dL — ABNORMAL HIGH (ref 70–99)
Potassium: 4 mEq/L (ref 3.5–5.1)
Sodium: 134 mEq/L — ABNORMAL LOW (ref 135–145)

## 2014-03-05 LAB — LIPID PANEL
Cholesterol: 190 mg/dL (ref 0–200)
HDL: 34.2 mg/dL — ABNORMAL LOW (ref >39.00)
LDL Cholesterol: 116 mg/dL — ABNORMAL HIGH (ref 0–99)
NONHDL: 155.8
TRIGLYCERIDES: 198 mg/dL — AB (ref 0.0–149.0)
Total CHOL/HDL Ratio: 6
VLDL: 39.6 mg/dL (ref 0.0–40.0)

## 2014-03-05 LAB — HEPATIC FUNCTION PANEL
ALK PHOS: 61 U/L (ref 39–117)
ALT: 19 U/L (ref 0–35)
AST: 21 U/L (ref 0–37)
Albumin: 4.3 g/dL (ref 3.5–5.2)
BILIRUBIN DIRECT: 0 mg/dL (ref 0.0–0.3)
Total Bilirubin: 0.4 mg/dL (ref 0.2–1.2)
Total Protein: 7.5 g/dL (ref 6.0–8.3)

## 2014-03-05 MED ORDER — PHENTERMINE HCL 37.5 MG PO CAPS: 37.5000 mg | ORAL_CAPSULE | ORAL | Status: DC

## 2014-03-05 NOTE — Patient Instructions (Signed)
Preventive Care for Adults A healthy lifestyle and preventive care can promote health and wellness. Preventive health guidelines for women include the following key practices.  A routine yearly physical is a good way to check with your health care provider about your health and preventive screening. It is a chance to share any concerns and updates on your health and to receive a thorough exam.  Visit your dentist for a routine exam and preventive care every 6 months. Brush your teeth twice a day and floss once a day. Good oral hygiene prevents tooth decay and gum disease.  The frequency of eye exams is based on your age, health, family medical history, use of contact lenses, and other factors. Follow your health care provider's recommendations for frequency of eye exams.  Eat a healthy diet. Foods like vegetables, fruits, whole grains, low-fat dairy products, and lean protein foods contain the nutrients you need without too many calories. Decrease your intake of foods high in solid fats, added sugars, and salt. Eat the right amount of calories for you.Get information about a proper diet from your health care provider, if necessary.  Regular physical exercise is one of the most important things you can do for your health. Most adults should get at least 150 minutes of moderate-intensity exercise (any activity that increases your heart rate and causes you to sweat) each week. In addition, most adults need muscle-strengthening exercises on 2 or more days a week.  Maintain a healthy weight. The body mass index (BMI) is a screening tool to identify possible weight problems. It provides an estimate of body fat based on height and weight. Your health care provider can find your BMI and can help you achieve or maintain a healthy weight.For adults 20 years and older:  A BMI below 18.5 is considered underweight.  A BMI of 18.5 to 24.9 is normal.  A BMI of 25 to 29.9 is considered overweight.  A BMI of  30 and above is considered obese.  Maintain normal blood lipids and cholesterol levels by exercising and minimizing your intake of saturated fat. Eat a balanced diet with plenty of fruit and vegetables. Blood tests for lipids and cholesterol should begin at age 76 and be repeated every 5 years. If your lipid or cholesterol levels are high, you are over 50, or you are at high risk for heart disease, you may need your cholesterol levels checked more frequently.Ongoing high lipid and cholesterol levels should be treated with medicines if diet and exercise are not working.  If you smoke, find out from your health care provider how to quit. If you do not use tobacco, do not start.  Lung cancer screening is recommended for adults aged 22-80 years who are at high risk for developing lung cancer because of a history of smoking. A yearly low-dose CT scan of the lungs is recommended for people who have at least a 30-pack-year history of smoking and are a current smoker or have quit within the past 15 years. A pack year of smoking is smoking an average of 1 pack of cigarettes a day for 1 year (for example: 1 pack a day for 30 years or 2 packs a day for 15 years). Yearly screening should continue until the smoker has stopped smoking for at least 15 years. Yearly screening should be stopped for people who develop a health problem that would prevent them from having lung cancer treatment.  If you are pregnant, do not drink alcohol. If you are breastfeeding,  be very cautious about drinking alcohol. If you are not pregnant and choose to drink alcohol, do not have more than 1 drink per day. One drink is considered to be 12 ounces (355 mL) of beer, 5 ounces (148 mL) of wine, or 1.5 ounces (44 mL) of liquor.  Avoid use of street drugs. Do not share needles with anyone. Ask for help if you need support or instructions about stopping the use of drugs.  High blood pressure causes heart disease and increases the risk of  stroke. Your blood pressure should be checked at least every 1 to 2 years. Ongoing high blood pressure should be treated with medicines if weight loss and exercise do not work.  If you are 75-52 years old, ask your health care provider if you should take aspirin to prevent strokes.  Diabetes screening involves taking a blood sample to check your fasting blood sugar level. This should be done once every 3 years, after age 15, if you are within normal weight and without risk factors for diabetes. Testing should be considered at a younger age or be carried out more frequently if you are overweight and have at least 1 risk factor for diabetes.  Breast cancer screening is essential preventive care for women. You should practice "breast self-awareness." This means understanding the normal appearance and feel of your breasts and may include breast self-examination. Any changes detected, no matter how small, should be reported to a health care provider. Women in their 58s and 30s should have a clinical breast exam (CBE) by a health care provider as part of a regular health exam every 1 to 3 years. After age 16, women should have a CBE every year. Starting at age 53, women should consider having a mammogram (breast X-ray test) every year. Women who have a family history of breast cancer should talk to their health care provider about genetic screening. Women at a high risk of breast cancer should talk to their health care providers about having an MRI and a mammogram every year.  Breast cancer gene (BRCA)-related cancer risk assessment is recommended for women who have family members with BRCA-related cancers. BRCA-related cancers include breast, ovarian, tubal, and peritoneal cancers. Having family members with these cancers may be associated with an increased risk for harmful changes (mutations) in the breast cancer genes BRCA1 and BRCA2. Results of the assessment will determine the need for genetic counseling and  BRCA1 and BRCA2 testing.  Routine pelvic exams to screen for cancer are no longer recommended for nonpregnant women who are considered low risk for cancer of the pelvic organs (ovaries, uterus, and vagina) and who do not have symptoms. Ask your health care provider if a screening pelvic exam is right for you.  If you have had past treatment for cervical cancer or a condition that could lead to cancer, you need Pap tests and screening for cancer for at least 20 years after your treatment. If Pap tests have been discontinued, your risk factors (such as having a new sexual partner) need to be reassessed to determine if screening should be resumed. Some women have medical problems that increase the chance of getting cervical cancer. In these cases, your health care provider may recommend more frequent screening and Pap tests.  The HPV test is an additional test that may be used for cervical cancer screening. The HPV test looks for the virus that can cause the cell changes on the cervix. The cells collected during the Pap test can be  tested for HPV. The HPV test could be used to screen women aged 30 years and older, and should be used in women of any age who have unclear Pap test results. After the age of 30, women should have HPV testing at the same frequency as a Pap test.  Colorectal cancer can be detected and often prevented. Most routine colorectal cancer screening begins at the age of 50 years and continues through age 75 years. However, your health care provider may recommend screening at an earlier age if you have risk factors for colon cancer. On a yearly basis, your health care provider may provide home test kits to check for hidden blood in the stool. Use of a small camera at the end of a tube, to directly examine the colon (sigmoidoscopy or colonoscopy), can detect the earliest forms of colorectal cancer. Talk to your health care provider about this at age 50, when routine screening begins. Direct  exam of the colon should be repeated every 5-10 years through age 75 years, unless early forms of pre-cancerous polyps or small growths are found.  People who are at an increased risk for hepatitis B should be screened for this virus. You are considered at high risk for hepatitis B if:  You were born in a country where hepatitis B occurs often. Talk with your health care provider about which countries are considered high risk.  Your parents were born in a high-risk country and you have not received a shot to protect against hepatitis B (hepatitis B vaccine).  You have HIV or AIDS.  You use needles to inject street drugs.  You live with, or have sex with, someone who has hepatitis B.  You get hemodialysis treatment.  You take certain medicines for conditions like cancer, organ transplantation, and autoimmune conditions.  Hepatitis C blood testing is recommended for all people born from 1945 through 1965 and any individual with known risks for hepatitis C.  Practice safe sex. Use condoms and avoid high-risk sexual practices to reduce the spread of sexually transmitted infections (STIs). STIs include gonorrhea, chlamydia, syphilis, trichomonas, herpes, HPV, and human immunodeficiency virus (HIV). Herpes, HIV, and HPV are viral illnesses that have no cure. They can result in disability, cancer, and death.  You should be screened for sexually transmitted illnesses (STIs) including gonorrhea and chlamydia if:  You are sexually active and are younger than 24 years.  You are older than 24 years and your health care provider tells you that you are at risk for this type of infection.  Your sexual activity has changed since you were last screened and you are at an increased risk for chlamydia or gonorrhea. Ask your health care provider if you are at risk.  If you are at risk of being infected with HIV, it is recommended that you take a prescription medicine daily to prevent HIV infection. This is  called preexposure prophylaxis (PrEP). You are considered at risk if:  You are a heterosexual woman, are sexually active, and are at increased risk for HIV infection.  You take drugs by injection.  You are sexually active with a partner who has HIV.  Talk with your health care provider about whether you are at high risk of being infected with HIV. If you choose to begin PrEP, you should first be tested for HIV. You should then be tested every 3 months for as long as you are taking PrEP.  Osteoporosis is a disease in which the bones lose minerals and strength   with aging. This can result in serious bone fractures or breaks. The risk of osteoporosis can be identified using a bone density scan. Women ages 65 years and over and women at risk for fractures or osteoporosis should discuss screening with their health care providers. Ask your health care provider whether you should take a calcium supplement or vitamin D to reduce the rate of osteoporosis.  Menopause can be associated with physical symptoms and risks. Hormone replacement therapy is available to decrease symptoms and risks. You should talk to your health care provider about whether hormone replacement therapy is right for you.  Use sunscreen. Apply sunscreen liberally and repeatedly throughout the day. You should seek shade when your shadow is shorter than you. Protect yourself by wearing long sleeves, pants, a wide-brimmed hat, and sunglasses year round, whenever you are outdoors.  Once a month, do a whole body skin exam, using a mirror to look at the skin on your back. Tell your health care provider of new moles, moles that have irregular borders, moles that are larger than a pencil eraser, or moles that have changed in shape or color.  Stay current with required vaccines (immunizations).  Influenza vaccine. All adults should be immunized every year.  Tetanus, diphtheria, and acellular pertussis (Td, Tdap) vaccine. Pregnant women should  receive 1 dose of Tdap vaccine during each pregnancy. The dose should be obtained regardless of the length of time since the last dose. Immunization is preferred during the 27th-36th week of gestation. An adult who has not previously received Tdap or who does not know her vaccine status should receive 1 dose of Tdap. This initial dose should be followed by tetanus and diphtheria toxoids (Td) booster doses every 10 years. Adults with an unknown or incomplete history of completing a 3-dose immunization series with Td-containing vaccines should begin or complete a primary immunization series including a Tdap dose. Adults should receive a Td booster every 10 years.  Varicella vaccine. An adult without evidence of immunity to varicella should receive 2 doses or a second dose if she has previously received 1 dose. Pregnant females who do not have evidence of immunity should receive the first dose after pregnancy. This first dose should be obtained before leaving the health care facility. The second dose should be obtained 4-8 weeks after the first dose.  Human papillomavirus (HPV) vaccine. Females aged 13-26 years who have not received the vaccine previously should obtain the 3-dose series. The vaccine is not recommended for use in pregnant females. However, pregnancy testing is not needed before receiving a dose. If a female is found to be pregnant after receiving a dose, no treatment is needed. In that case, the remaining doses should be delayed until after the pregnancy. Immunization is recommended for any person with an immunocompromised condition through the age of 26 years if she did not get any or all doses earlier. During the 3-dose series, the second dose should be obtained 4-8 weeks after the first dose. The third dose should be obtained 24 weeks after the first dose and 16 weeks after the second dose.  Zoster vaccine. One dose is recommended for adults aged 60 years or older unless certain conditions are  present.  Measles, mumps, and rubella (MMR) vaccine. Adults born before 1957 generally are considered immune to measles and mumps. Adults born in 1957 or later should have 1 or more doses of MMR vaccine unless there is a contraindication to the vaccine or there is laboratory evidence of immunity to   each of the three diseases. A routine second dose of MMR vaccine should be obtained at least 28 days after the first dose for students attending postsecondary schools, health care workers, or international travelers. People who received inactivated measles vaccine or an unknown type of measles vaccine during 1963-1967 should receive 2 doses of MMR vaccine. People who received inactivated mumps vaccine or an unknown type of mumps vaccine before 1979 and are at high risk for mumps infection should consider immunization with 2 doses of MMR vaccine. For females of childbearing age, rubella immunity should be determined. If there is no evidence of immunity, females who are not pregnant should be vaccinated. If there is no evidence of immunity, females who are pregnant should delay immunization until after pregnancy. Unvaccinated health care workers born before 1957 who lack laboratory evidence of measles, mumps, or rubella immunity or laboratory confirmation of disease should consider measles and mumps immunization with 2 doses of MMR vaccine or rubella immunization with 1 dose of MMR vaccine.  Pneumococcal 13-valent conjugate (PCV13) vaccine. When indicated, a person who is uncertain of her immunization history and has no record of immunization should receive the PCV13 vaccine. An adult aged 19 years or older who has certain medical conditions and has not been previously immunized should receive 1 dose of PCV13 vaccine. This PCV13 should be followed with a dose of pneumococcal polysaccharide (PPSV23) vaccine. The PPSV23 vaccine dose should be obtained at least 8 weeks after the dose of PCV13 vaccine. An adult aged 19  years or older who has certain medical conditions and previously received 1 or more doses of PPSV23 vaccine should receive 1 dose of PCV13. The PCV13 vaccine dose should be obtained 1 or more years after the last PPSV23 vaccine dose.  Pneumococcal polysaccharide (PPSV23) vaccine. When PCV13 is also indicated, PCV13 should be obtained first. All adults aged 65 years and older should be immunized. An adult younger than age 65 years who has certain medical conditions should be immunized. Any person who resides in a nursing home or long-term care facility should be immunized. An adult smoker should be immunized. People with an immunocompromised condition and certain other conditions should receive both PCV13 and PPSV23 vaccines. People with human immunodeficiency virus (HIV) infection should be immunized as soon as possible after diagnosis. Immunization during chemotherapy or radiation therapy should be avoided. Routine use of PPSV23 vaccine is not recommended for American Indians, [place] Natives, or people younger than 65 years unless there are medical conditions that require PPSV23 vaccine. When indicated, people who have unknown immunization and have no record of immunization should receive PPSV23 vaccine. One-time revaccination 5 years after the first dose of PPSV23 is recommended for people aged 19-64 years who have chronic kidney failure, nephrotic syndrome, asplenia, or immunocompromised conditions. People who received 1-2 doses of PPSV23 before age 65 years should receive another dose of PPSV23 vaccine at age 65 years or later if at least 5 years have passed since the previous dose. Doses of PPSV23 are not needed for people immunized with PPSV23 at or after age 65 years.  Meningococcal vaccine. Adults with asplenia or persistent complement component deficiencies should receive 2 doses of quadrivalent meningococcal conjugate (MenACWY-D) vaccine. The doses should be obtained at least 2 months apart.  Microbiologists working with certain meningococcal bacteria, military recruits, people at risk during an outbreak, and people who travel to or live in countries with a high rate of meningitis should be immunized. A first-year college student up through age   21 years who is living in a residence hall should receive a dose if she did not receive a dose on or after her 16th birthday. Adults who have certain high-risk conditions should receive one or more doses of vaccine.  Hepatitis A vaccine. Adults who wish to be protected from this disease, have certain high-risk conditions, work with hepatitis A-infected animals, work in hepatitis A research labs, or travel to or work in countries with a high rate of hepatitis A should be immunized. Adults who were previously unvaccinated and who anticipate close contact with an international adoptee during the first 60 days after arrival in the [place] from a country with a high rate of hepatitis A should be immunized.  Hepatitis B vaccine. Adults who wish to be protected from this disease, have certain high-risk conditions, may be exposed to blood or other infectious body fluids, are household contacts or sex partners of hepatitis B positive people, are clients or workers in certain care facilities, or travel to or work in countries with a high rate of hepatitis B should be immunized.  Haemophilus influenzae type b (Hib) vaccine. A previously unvaccinated person with asplenia or sickle cell disease or having a scheduled splenectomy should receive 1 dose of Hib vaccine. Regardless of previous immunization, a recipient of a hematopoietic stem cell transplant should receive a 3-dose series 6-12 months after her successful transplant. Hib vaccine is not recommended for adults with HIV infection. Preventive Services / Frequency Ages 64 to 68 years  Blood pressure check.** / Every 1 to 2 years.  Lipid and cholesterol check.** / Every 5 years beginning at age  22.  Clinical breast exam.** / Every 3 years for women in their 88s and 53s.  BRCA-related cancer risk assessment.** / For women who have family members with a BRCA-related cancer (breast, ovarian, tubal, or peritoneal cancers).  Pap test.** / Every 2 years from ages 90 through 51. Every 3 years starting at age 21 through age 56 or 3 with a history of 3 consecutive normal Pap tests.  HPV screening.** / Every 3 years from ages 24 through ages 1 to 46 with a history of 3 consecutive normal Pap tests.  Hepatitis C blood test.** / For any individual with known risks for hepatitis C.  Skin self-exam. / Monthly.  Influenza vaccine. / Every year.  Tetanus, diphtheria, and acellular pertussis (Tdap, Td) vaccine.** / Consult your health care provider. Pregnant women should receive 1 dose of Tdap vaccine during each pregnancy. 1 dose of Td every 10 years.  Varicella vaccine.** / Consult your health care provider. Pregnant females who do not have evidence of immunity should receive the first dose after pregnancy.  HPV vaccine. / 3 doses over 6 months, if 72 and younger. The vaccine is not recommended for use in pregnant females. However, pregnancy testing is not needed before receiving a dose.  Measles, mumps, rubella (MMR) vaccine.** / You need at least 1 dose of MMR if you were born in 1957 or later. You may also need a 2nd dose. For females of childbearing age, rubella immunity should be determined. If there is no evidence of immunity, females who are not pregnant should be vaccinated. If there is no evidence of immunity, females who are pregnant should delay immunization until after pregnancy.  Pneumococcal 13-valent conjugate (PCV13) vaccine.** / Consult your health care provider.  Pneumococcal polysaccharide (PPSV23) vaccine.** / 1 to 2 doses if you smoke cigarettes or if you have certain conditions.  Meningococcal vaccine.** /  1 dose if you are age 19 to 21 years and a first-year college  student living in a residence hall, or have one of several medical conditions, you need to get vaccinated against meningococcal disease. You may also need additional booster doses.  Hepatitis A vaccine.** / Consult your health care provider.  Hepatitis B vaccine.** / Consult your health care provider.  Haemophilus influenzae type b (Hib) vaccine.** / Consult your health care provider. Ages 40 to 64 years  Blood pressure check.** / Every 1 to 2 years.  Lipid and cholesterol check.** / Every 5 years beginning at age 20 years.  Lung cancer screening. / Every year if you are aged 55-80 years and have a 30-pack-year history of smoking and currently smoke or have quit within the past 15 years. Yearly screening is stopped once you have quit smoking for at least 15 years or develop a health problem that would prevent you from having lung cancer treatment.  Clinical breast exam.** / Every year after age 40 years.  BRCA-related cancer risk assessment.** / For women who have family members with a BRCA-related cancer (breast, ovarian, tubal, or peritoneal cancers).  Mammogram.** / Every year beginning at age 40 years and continuing for as long as you are in good health. Consult with your health care provider.  Pap test.** / Every 3 years starting at age 30 years through age 65 or 70 years with a history of 3 consecutive normal Pap tests.  HPV screening.** / Every 3 years from ages 30 years through ages 65 to 70 years with a history of 3 consecutive normal Pap tests.  Fecal occult blood test (FOBT) of stool. / Every year beginning at age 50 years and continuing until age 75 years. You may not need to do this test if you get a colonoscopy every 10 years.  Flexible sigmoidoscopy or colonoscopy.** / Every 5 years for a flexible sigmoidoscopy or every 10 years for a colonoscopy beginning at age 50 years and continuing until age 75 years.  Hepatitis C blood test.** / For all people born from 1945 through  1965 and any individual with known risks for hepatitis C.  Skin self-exam. / Monthly.  Influenza vaccine. / Every year.  Tetanus, diphtheria, and acellular pertussis (Tdap/Td) vaccine.** / Consult your health care provider. Pregnant women should receive 1 dose of Tdap vaccine during each pregnancy. 1 dose of Td every 10 years.  Varicella vaccine.** / Consult your health care provider. Pregnant females who do not have evidence of immunity should receive the first dose after pregnancy.  Zoster vaccine.** / 1 dose for adults aged 60 years or older.  Measles, mumps, rubella (MMR) vaccine.** / You need at least 1 dose of MMR if you were born in 1957 or later. You may also need a 2nd dose. For females of childbearing age, rubella immunity should be determined. If there is no evidence of immunity, females who are not pregnant should be vaccinated. If there is no evidence of immunity, females who are pregnant should delay immunization until after pregnancy.  Pneumococcal 13-valent conjugate (PCV13) vaccine.** / Consult your health care provider.  Pneumococcal polysaccharide (PPSV23) vaccine.** / 1 to 2 doses if you smoke cigarettes or if you have certain conditions.  Meningococcal vaccine.** / Consult your health care provider.  Hepatitis A vaccine.** / Consult your health care provider.  Hepatitis B vaccine.** / Consult your health care provider.  Haemophilus influenzae type b (Hib) vaccine.** / Consult your health care provider. Ages 65   years and over  Blood pressure check.** / Every 1 to 2 years.  Lipid and cholesterol check.** / Every 5 years beginning at age 22 years.  Lung cancer screening. / Every year if you are aged 73-80 years and have a 30-pack-year history of smoking and currently smoke or have quit within the past 15 years. Yearly screening is stopped once you have quit smoking for at least 15 years or develop a health problem that would prevent you from having lung cancer  treatment.  Clinical breast exam.** / Every year after age 4 years.  BRCA-related cancer risk assessment.** / For women who have family members with a BRCA-related cancer (breast, ovarian, tubal, or peritoneal cancers).  Mammogram.** / Every year beginning at age 40 years and continuing for as long as you are in good health. Consult with your health care provider.  Pap test.** / Every 3 years starting at age 9 years through age 34 or 91 years with 3 consecutive normal Pap tests. Testing can be stopped between 65 and 70 years with 3 consecutive normal Pap tests and no abnormal Pap or HPV tests in the past 10 years.  HPV screening.** / Every 3 years from ages 57 years through ages 64 or 45 years with a history of 3 consecutive normal Pap tests. Testing can be stopped between 65 and 70 years with 3 consecutive normal Pap tests and no abnormal Pap or HPV tests in the past 10 years.  Fecal occult blood test (FOBT) of stool. / Every year beginning at age 15 years and continuing until age 17 years. You may not need to do this test if you get a colonoscopy every 10 years.  Flexible sigmoidoscopy or colonoscopy.** / Every 5 years for a flexible sigmoidoscopy or every 10 years for a colonoscopy beginning at age 86 years and continuing until age 71 years.  Hepatitis C blood test.** / For all people born from [birth year] through 1966 and any individual with known risks for hepatitis C.  Osteoporosis screening.** / A one-time screening for women ages 83 years and over and women at risk for fractures or osteoporosis.  Skin self-exam. / Monthly.  Influenza vaccine. / Every year.  Tetanus, diphtheria, and acellular pertussis (Tdap/Td) vaccine.** / 1 dose of Td every 10 years.  Varicella vaccine.** / Consult your health care provider.  Zoster vaccine.** / 1 dose for adults aged 61 years or older.  Pneumococcal 13-valent conjugate (PCV13) vaccine.** / Consult your health care provider.  Pneumococcal  polysaccharide (PPSV23) vaccine.** / 1 dose for all adults aged 28 years and older.  Meningococcal vaccine.** / Consult your health care provider.  Hepatitis A vaccine.** / Consult your health care provider.  Hepatitis B vaccine.** / Consult your health care provider.  Haemophilus influenzae type b (Hib) vaccine.** / Consult your health care provider. ** Family history and personal history of risk and conditions may change your health care provider's recommendations. Document Released: 07/27/2001 Document Revised: 10/15/2013 Document Reviewed: 10/26/2010 [facility] Patient Information 2015 [place], [place]. This information is not intended to replace advice given to you by your health care provider. Make sure you discuss any questions you have with your health care provider.

## 2014-03-05 NOTE — Progress Notes (Signed)
Pre visit review using our clinic review tool, if applicable. No additional management support is needed unless otherwise documented below in the visit note. 

## 2014-03-05 NOTE — Telephone Encounter (Signed)
Patient is aware HPV was added to her Pap and she is happy due to previous issues with abnormal pap's     KP

## 2014-03-05 NOTE — Telephone Encounter (Signed)
Caller name: Cindy Stephens Relation to pt: self Call back number: [phone] Pharmacy:  Reason for call:   Patient would like to know if the swab that was taken for pap today also is for HPV?

## 2014-03-05 NOTE — Progress Notes (Signed)
Subjective:     Cindy Stephens is a 48 y.o. female and is here for a comprehensive physical exam. The patient reports problems - multiple issues with numbness in arms and legs/ feet.  Pt is seeing sport med for her ankle and is doing better.   Pt spent a majority of the visit talking about a vague numbness in thighs L >R and feet.  No neck or back problems.   Pt has been to rheum about + ana but lupus neg.  It may be fibromyalgia is a possibility.  She is also grinding her teeth and is seeing a dentist.      History   Social History  . Marital Status: Married    Spouse Name: N/A    Number of Children: N/A  . Years of Education: N/A   Occupational History  . Not on file.   Social History Main Topics  . Smoking status: Never Smoker   . Smokeless tobacco: Never Used  . Alcohol Use: Yes     Comment: Occ  . Drug Use: No  . Sexual Activity: Yes    Partners: Male   Other Topics Concern  . Not on file   Social History Narrative  . No narrative on file   Health Maintenance  Topic Date Due  . Pap Smear  12/26/1982  . Tetanus/tdap  06/14/2014  . Influenza Vaccine  01/13/2015    The following portions of the patient's history were reviewed and updated as appropriate:  She  has a past medical history of Shoulder dislocation (2007); Depression; GERD (gastroesophageal reflux disease); Allergic rhinitis; and Urinary incontinence. She  does not have any pertinent problems on file. She  has past surgical history that includes Elbow surgery (Left, 2007) and Appendectomy. Her family history includes Alzheimer's disease in her father; Arthritis in her father, mother, sister, and another family member; Diabetes in her paternal grandmother; Heart disease in an other family member; Hyperlipidemia in her father; Hypertension in her father; Osteoarthritis in her brother; Stroke in an other family member. She  reports that she has never smoked. She has never used smokeless tobacco. She reports that  she drinks alcohol. She reports that she does not use illicit drugs. She has a current medication list which includes the following prescription(s): aspirin, co-enzyme q-10, dhea, diclofenac sodium, fluticasone, glucosamine-chondroitin, iron, loratadine, magnesium oxide, maitake mushroom, melatonin, estroven energy, montelukast, naproxen sodium, omega 3-6-9 fatty acids, fish oil, omeprazole, phentermine, phenylephrine-dm-gg-apap, potassium, probiotic product, pseudoephedrine, red yeast rice extract, turmeric, and vitamin b-12. Current Outpatient Prescriptions on File Prior to Visit  Medication Sig Dispense Refill  . aspirin 81 MG tablet Take 81 mg by mouth daily.      Marland Kitchen co-enzyme Q-10 30 MG capsule Take 30 mg by mouth 3 (three) times daily.      Marland Kitchen DHEA 10 MG TABS Take by mouth.      . Diclofenac Sodium 2 % SOLN Apply twice daily.  112 g  1  . fluticasone (FLONASE) 50 MCG/ACT nasal spray Place into both nostrils daily.      Marland Kitchen glucosamine-chondroitin 500-400 MG tablet Take 1 tablet by mouth 2 (two) times daily.      Marland Kitchen loratadine (CLARITIN) 10 MG tablet Take 10 mg by mouth daily.      . Melatonin 10 MG CAPS Take by mouth.      . Misc Natural Products (ESTROVEN ENERGY) TABS Take 1 tablet by mouth 2 (two) times daily.      Marland Kitchen  montelukast (SINGULAIR) 10 MG tablet Take 10 mg by mouth at bedtime.      . Omega 3-6-9 Fatty Acids (OMEGA 3-6-9 COMPLEX PO) Take by mouth.      . Omega-3 Fatty Acids (FISH OIL) 1000 MG CAPS Take 1 capsule by mouth.      Marland Kitchen omeprazole (PRILOSEC OTC) 20 MG tablet Take 20 mg by mouth daily.      . phentermine (ADIPEX-P) 37.5 MG tablet Take 1 tablet (37.5 mg total) by mouth daily before breakfast.  30 tablet  0  . Phenylephrine-DM-GG-APAP (MUCINEX FAST-MAX) 10-20-400-650 MG PACK Take by mouth.      . pseudoephedrine (SUDAFED) 30 MG tablet Take 30 mg by mouth every 4 (four) hours as needed for congestion.      . vitamin B-12 (CYANOCOBALAMIN) 1000 MCG tablet Take 1,000 mcg by mouth  daily.       No current facility-administered medications on file prior to visit.   She is allergic to sulfa antibiotics..  Review of Systems Review of Systems  Constitutional: Negative for activity change, appetite change and fatigue.  HENT: Negative for hearing loss, congestion, tinnitus and ear discharge.  dentist q22m Eyes: Negative for visual disturbance (see optho q1y -- vision corrected to 20/20 with glasses).  Respiratory: Negative for cough, chest tightness and shortness of breath.   Cardiovascular: Negative for chest pain, palpitations and leg swelling.  Gastrointestinal: Negative for abdominal pain, diarrhea, constipation and abdominal distention.  Genitourinary: Negative for urgency, frequency, decreased urine volume and difficulty urinating.  Musculoskeletal: Negative for back pain, arthralgias and gait problem.  Skin: Negative for color change, pallor and rash.  Neurological: Negative for dizziness, light-headedness and headaches. + numbness Hematological: Negative for adenopathy. Does not bruise/bleed easily.  Psychiatric/Behavioral: Negative for suicidal ideas, confusion, sleep disturbance, self-injury, dysphoric mood, decreased concentration and agitation.       Objective:    BP 112/62  Pulse 97  Temp(Src) 98.8 F (37.1 C) (Oral)  Ht 5' 3.75" (1.619 m)  Wt 187 lb 9.8 oz (85.1 kg)  BMI 32.47 kg/m2  SpO2 99%  LMP 02/28/2014 General appearance: alert, cooperative, appears stated age and no distress Head: Normocephalic, without obvious abnormality, atraumatic Eyes: conjunctivae/corneas clear. PERRL, EOM's intact. Fundi benign. Ears: normal TM's and external ear canals both ears Nose: Nares normal. Septum midline. Mucosa normal. No drainage or sinus tenderness. Throat: lips, mucosa, and tongue normal; teeth and gums normal Neck: no adenopathy, no carotid bruit, no JVD, supple, symmetrical, trachea midline and thyroid not enlarged, symmetric, no  tenderness/mass/nodules Back: symmetric, no curvature. ROM normal. No CVA tenderness. Lungs: clear to auscultation bilaterally Breasts: normal appearance, no masses or tenderness Heart: regular rate and rhythm, S1, S2 normal, no murmur, click, rub or gallop Abdomen: soft, non-tender; bowel sounds normal; no masses,  no organomegaly Pelvic: cervix normal in appearance, external genitalia normal, no adnexal masses or tenderness, no cervical motion tenderness, rectovaginal septum normal, uterus normal size, shape, and consistency, vagina normal without discharge and pap done Extremities: extremities normal, atraumatic, no cyanosis or edema Pulses: 2+ and symmetric Skin: Skin color, texture, turgor normal. No rashes or lesions Lymph nodes: Cervical, supraclavicular, and axillary nodes normal. Neurologic: Alert and oriented X 3, normal strength and tone. Normal symmetric reflexes. Normal coordination and gait Psych-- no depression, no anxiety      Assessment:    Healthy female exam.      Plan:    ghm utd Check labs See After Visit Summary for Counseling Recommendations  1. Preventative health care   - Basic metabolic panel - Hepatic function panel - Lipid panel  2. Family history of melanoma   - Ambulatory referral to Dermatology  3. Numbness of both lower extremities   - Ambulatory referral to Neurology  4. Numbness of upper extremity   - Ambulatory referral to Neurology  5. Overweight   - phentermine 37.5 MG capsule; Take 1 capsule (37.5 mg total) by mouth every morning.  Dispense: 30 capsule; Refill: 0  6. Screening for malignant neoplasm of the cervix   - Cytology - PAP

## 2014-03-06 LAB — CYTOLOGY - PAP

## 2014-03-07 ENCOUNTER — Ambulatory Visit (INDEPENDENT_AMBULATORY_CARE_PROVIDER_SITE_OTHER): Payer: Self-pay | Admitting: Family Medicine

## 2014-03-07 ENCOUNTER — Encounter: Payer: Self-pay | Admitting: Family Medicine

## 2014-03-07 VITALS — BP 106/72 | HR 79 | Ht 63.75 in | Wt 186.0 lb

## 2014-03-07 DIAGNOSIS — R0683 Snoring: Secondary | ICD-10-CM | POA: Insufficient documentation

## 2014-03-07 DIAGNOSIS — R0989 Other specified symptoms and signs involving the circulatory and respiratory systems: Secondary | ICD-10-CM

## 2014-03-07 DIAGNOSIS — M7672 Peroneal tendinitis, left leg: Secondary | ICD-10-CM

## 2014-03-07 DIAGNOSIS — R0609 Other forms of dyspnea: Secondary | ICD-10-CM

## 2014-03-07 DIAGNOSIS — M7750 Other enthesopathy of unspecified foot: Secondary | ICD-10-CM

## 2014-03-07 NOTE — Progress Notes (Signed)
  [name] [address] [place], [place] [address] Phone: [phone] Subjective:    I'm seeing this patient by the request  of:  Loreen Freud, DO   CC: Foot pain, left  [id number] Cindy Stephens is a 48 y.o. female coming in with complaint of foot pain. Patient was found to have a peroneal tendinitis. Patient has been doing the exercises, sometimes the icing, as well as bracing. Patient states that she is approximately 50-60% better. Patient states overall she seems to be doing better. Denies any new symptoms.  She does still give the that is actually occurs mostly when she is sleeping. When asked she does state that she snores significantly per her husband.      Past medical history, social, surgical and family history all reviewed in electronic medical record.   Review of Systems: No headache, visual changes, nausea, vomiting, diarrhea, constipation, dizziness, abdominal pain, skin rash, fevers, chills, night sweats, weight loss, swollen lymph nodes, body aches, joint swelling, muscle aches, chest pain, shortness of breath, mood changes.   Objective Blood pressure 106/72, pulse 79, height 5' 3.75" (1.619 m), weight 186 lb (84.369 kg), last menstrual period 02/28/2014, SpO2 95.00%.  General: No apparent distress alert and oriented x3 mood and affect normal, dressed appropriately. Overweight HEENT: Pupils equal, extraocular movements intact  Respiratory: Patient's speak in full sentences and does not appear short of breath  Cardiovascular: No lower extremity edema, non tender, no erythema  Skin: Warm dry intact with no signs of infection or rash on extremities or on axial skeleton.  Abdomen: Soft nontender  Neuro: Cranial nerves II through XII are intact, neurovascularly intact in all extremities with 2+ DTRs and 2+ pulses.  Lymph: No lymphadenopathy of posterior or anterior cervical chain or axillae bilaterally.  Gait mild antalgic gait  MSK:  Non  tender with full range of motion and good stability and symmetric strength and tone of shoulders, elbows, wrist, hip, knee and bilaterally.  Ankle: Left No visible erythema. Range of motion is full in all directions. Strength is 5/5 in all directions. Stable lateral and medial ligaments; squeeze test and kleiger test unremarkable; Talar dome nontender; No pain at base of 5th MT; No tenderness over cuboid; No tenderness over N spot or navicular prominence No tenderness on posterior aspects of lateral and medial malleolus Tender over her peroneal tendons which is an improvement. Able to walk 4 steps. Contralateral ankle unremarkable  MSK US performed of: Left ankle This study was ordered, performed, and interpreted by Terrilee Files D.O.  Foot/Ankle:   All structures visualized.   Talar dome unremarkable  Ankle mortise with mild effusion. Peroneus longus and brevis tendons to pass improvement with decreased hypoechoic changes.. In addition patient also has a peroneal quadratus noted Posterior tibialis, flexor hallucis longus, and flexor digitorum longus tendons unremarkable on long and transverse views without sheath effusions. Achilles tendon visualized along length of tendon and unremarkable on long and transverse views without sheath effusion. Anterior Talofibular Ligament and Calcaneofibular Ligaments unremarkable and intact. Deltoid Ligament unremarkable and intact. Plantar fascia intact and without effusion, normal thickness. No increased doppler signal, cap sign, or thickening of tibial cortex. Power doppler signal normal.  IMPRESSION: Peroneal tendinitis with peroneal quadratus improved hypoechoic changes      Impression and Recommendations:     This case required medical decision making of moderate complexity.

## 2014-03-07 NOTE — Assessment & Plan Note (Signed)
Cindy Stephens is improving overall. We discussed continuing the icing and patient was given phase II exercises. We discussed the possibility of over-the-counter orthotics they can also make a benefit. We discussed avoiding walking barefoot for a little longer. Patient is going to start returning to walking slowly and increasing her mileage as well as frequency over the course of the next month. Patient will follow up in one month for further evaluation and treatment. Spent greater than 25 minutes with patient face-to-face and had greater than 50% of counseling including as described above in assessment and plan.

## 2014-03-07 NOTE — Patient Instructions (Signed)
Good to see you Ice at the end of activity Start walking but only 3 times a week. Start 1.5 miles for the first week then up to 3 miles daily.  For 4 weeks continue only 3 times a week walking Continue the exercises 3 times a week for another month Pennsaid twice daily as needed Spenco orthotics online-  "total Support" Consider the sleep study.  Come back in 4-6 weeks.

## 2014-03-08 ENCOUNTER — Encounter: Payer: Self-pay | Admitting: Family Medicine

## 2014-03-14 ENCOUNTER — Encounter: Payer: Self-pay | Admitting: Family Medicine

## 2014-03-14 ENCOUNTER — Other Ambulatory Visit: Payer: Self-pay | Admitting: Family Medicine

## 2014-03-14 DIAGNOSIS — Z8349 Family history of other endocrine, nutritional and metabolic diseases: Secondary | ICD-10-CM

## 2014-03-14 NOTE — Progress Notes (Signed)
If she is still feeling bad and rheum said she does not have Lupus--- ok to refer to Endo--- I just did not want to refer her several places at once

## 2014-03-15 ENCOUNTER — Telehealth: Payer: Self-pay | Admitting: Family Medicine

## 2014-03-15 NOTE — Telephone Encounter (Signed)
Pt is requesting PAP results, has a few questions about referral to Endocrinology. Requesting call back

## 2014-03-15 NOTE — Telephone Encounter (Signed)
Discussed with patient and she voiced understanding,She is ok with the endo referral. See labs for details of the call.     KP

## 2014-04-09 ENCOUNTER — Ambulatory Visit: Payer: Self-pay | Admitting: Family Medicine

## 2014-04-11 ENCOUNTER — Ambulatory Visit (INDEPENDENT_AMBULATORY_CARE_PROVIDER_SITE_OTHER): Payer: Self-pay | Admitting: Internal Medicine

## 2014-04-11 ENCOUNTER — Encounter: Payer: Self-pay | Admitting: Internal Medicine

## 2014-04-11 VITALS — BP 112/74 | HR 86 | Temp 97.9°F | Resp 12 | Ht 63.5 in | Wt 188.0 lb

## 2014-04-11 DIAGNOSIS — E669 Obesity, unspecified: Secondary | ICD-10-CM

## 2014-04-11 NOTE — Patient Instructions (Signed)
Please collect a 24 h urine sample. Patient information (Up-to-Date): Collection of a 24-hour urine specimen  - You should collect every drop of urine during each 24-hour period. It does not matter how much or little urine is passed each time, as long as every drop is collected. - Begin the urine collection in the morning after you wake up, after you have emptied your bladder for the first time. - Urinate (empty the bladder) for the first time and flush it down the toilet. Note the exact time (eg, 6:15 AM). You will begin the urine collection at this time. - Collect every drop of urine during the day and night in an empty collection bottle. Store the bottle at room temperature or in the refrigerator. - If you need to have a bowel movement, any urine passed with the bowel movement should be collected. Try not to include feces with the urine collection. If feces does get mixed in, do not try to remove the feces from the urine collection bottle. - Finish by collecting the first urine passed the next morning, adding it to the collection bottle. This should be within ten minutes before or after the time of the first morning void on the first day (which was flushed). In this example, you would try to void between 6:05 and 6:25 on the second day. - If you need to urinate one hour before the final collection time, drink a full glass of water so that you can void again at the appropriate time. If you have to urinate 20 minutes before, try to hold the urine until the proper time. - Please note the exact time of the final collection, even if it is not the same time as when collection began on day 1. - The bottle(s) may be kept at room temperature for a day or two, but should be kept cool or refrigerated for longer periods of time.  We will schedule a new appointment if the labs are abnormal.

## 2014-04-11 NOTE — Progress Notes (Addendum)
Patient ID: CLARIE Stephens, female   DOB: [birth year], 48 y.o.   MRN: [id number]   HPI  Cindy Stephens is a 55 y.o.-year-old female, referred by her PCP, Dr. Laury Axon, in consultation for ?hypothyroidism (has FH of Hashimoto's hypothyroidism). She is concerned about her obesity.  She established care with Dr Laury Axon this summer, after she developed shoulder pain (bursitis).  I reviewed pt's thyroid tests - normal: Lab Results  Component Value Date   TSH 1.45 11/12/2013   FREET4 0.66 11/12/2013   Thyroid Ab's reviewed - negative: Component     Latest Ref Rng 11/12/2013  Thyroid Peroxidase Antibody     <35.0 IU/mL <10.0  Thyroglobulin Ab     <40.0 IU/mL 26.6   Pt describes: - +  weight gain: 2008-2009 (140-145 lbs) >> 2015 (188 lbs) - gained 10 lbs in the last year. She is trying to lose weight, tried phentermine >> stopped last week as she did not have any effect on her weight. - + fatigue - + insomnia - no cold intolerance; + hot flushes - no depression/anxiety - + constipation - lifelong - no hair falling - no irregular menses  Pt denies feeling nodules in neck, hoarseness, dysphagia/odynophagia, SOB with lying down.  She has + FH of thyroid disorders in: mother, GF, brother - both with Hashimoto's ds. No FH of thyroid cancer.  No h/o radiation tx to head or neck. No recent use of iodine supplements, tried this 2 years ago.   She is very frustrated by her weight gain, upon discussion, would like to make sure this is not an adrenal pb.  As mentioned above, her weight gain started in 2008.  Meals: - Breakfast: Toast with scrambled eggs or [place] yogurt/fruit or oatmeal - Lunch: Rose chicken, salad, leftovers, soup, stew, chili - Dinner: Meat, veggies, sometimes pasta - Snacks: Off-and-on throughout the day  Not exercising currently, she was exercising this summer until she developed shoulder pain.  Last Prednisone taper 05/2014, had many courses in the past for dental  work.  ROS: Constitutional: see HPI Eyes: no blurry vision, no xerophthalmia ENT: no sore throat, no nodules palpated in throat, no dysphagia/odynophagia, no hoarseness Cardiovascular: no CP/ + SOB/no palpitations/+ leg swelling Respiratory: had cough/+ SOB Gastrointestinal: no N/V/D/+C/+ heartburn Musculoskeletal: + Both: muscle/joint aches Skin: no rashes, + easy bruising, + itching Neurological: no tremors/numbness/tingling/dizziness Psychiatric: no depression/anxiety + Low libido  Past Medical History  Diagnosis Date  . Shoulder dislocation 2007    Left Shoulder  . Depression   . GERD (gastroesophageal reflux disease)   . Allergic rhinitis   . Urinary incontinence    Past Surgical History  Procedure Laterality Date  . Elbow surgery Left 2007    elbow was shattered  . Appendectomy     History   Social History  . Marital Status: Married    Spouse Name: N/A    Number of Children: 2   Occupational History  . 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 Smoking status: Never Smoker   . Smokeless tobacco: Never Used  . Alcohol Use: Yes     Comment: Occ: wine, beer - varies: 5-6x a week  . Drug Use: No   Current Outpatient Prescriptions on File Prior to Visit  Medication Sig Dispense Refill  . aspirin 81 MG tablet Take 81 mg by mouth daily.      Marland Kitchen co-enzyme Q-10 30 MG capsule Take 30 mg by  mouth 3 (three) times daily.      Marland Kitchen. DHEA 10 MG TABS Take by mouth.      . Diclofenac Sodium 2 % SOLN Apply twice daily.  112 g  1  . fluticasone (FLONASE) 50 MCG/ACT nasal spray Place into both nostrils daily.      Marland Kitchen. glucosamine-chondroitin 500-400 MG tablet Take 1 tablet by mouth 2 (two) times daily.      . IRON PO Take by mouth.      . loratadine (CLARITIN) 10 MG tablet Take 10 mg by mouth daily.      . magnesium oxide (MAG-OX) 400 MG tablet Take 400 mg by mouth daily.      Carlene Coria. [place] Mushroom POWD by Does not apply route.      .  Melatonin 10 MG CAPS Take by mouth.      . Misc Natural Products (ESTROVEN ENERGY) TABS Take 1 tablet by mouth 2 (two) times daily.      . montelukast (SINGULAIR) 10 MG tablet Take 10 mg by mouth at bedtime.      . naproxen sodium (ANAPROX) 220 MG tablet Take 220 mg by mouth 2 (two) times daily with a meal.      . Omega 3-6-9 Fatty Acids (OMEGA 3-6-9 COMPLEX PO) Take by mouth.      . Omega-3 Fatty Acids (FISH OIL) 1000 MG CAPS Take 1 capsule by mouth.      Marland Kitchen. omeprazole (PRILOSEC OTC) 20 MG tablet Take 20 mg by mouth daily.      . phentermine (ADIPEX-P) 37.5 MG tablet Take 1 tablet (37.5 mg total) by mouth daily before breakfast.  30 tablet  0  . phentermine 37.5 MG capsule Take 1 capsule (37.5 mg total) by mouth every morning.  30 capsule  0  . Phenylephrine-DM-GG-APAP (MUCINEX FAST-MAX) 10-20-400-650 MG PACK Take by mouth.      . Potassium 99 MG TABS Take by mouth.      . Probiotic Product (PROBIOTIC DAILY PO) Take by mouth.      . pseudoephedrine (SUDAFED) 30 MG tablet Take 30 mg by mouth every 4 (four) hours as needed for congestion.      . Red Yeast Rice Extract (RED YEAST RICE PO) Take by mouth.      . Turmeric 500 MG CAPS Take by mouth.      . vitamin B-12 (CYANOCOBALAMIN) 1000 MCG tablet Take 1,000 mcg by mouth daily.       No current facility-administered medications on file prior to visit.   Allergies  Allergen Reactions  . Sulfa Antibiotics Hives and Nausea Only   Family History  Problem Relation Age of Onset  . Arthritis    . Osteoarthritis Brother   . Arthritis Sister   . Arthritis Mother   . Arthritis Father   . Hyperlipidemia Father   . Alzheimer's disease Father   . Diabetes Paternal Grandmother     PGGM  . Hypertension Father     Paternal Family  . Heart disease      Pateternal Family  . Stroke      Paternal Family   PE: BP 112/74  Pulse 86  Temp(Src) 97.9 F (36.6 C) (Oral)  Resp 12  Ht 5' 3.5" (1.613 m)  Wt 188 lb (85.276 kg)  BMI 32.78 kg/m2  SpO2  98% Wt Readings from Last 3 Encounters:  04/11/14 188 lb (85.276 kg)  03/07/14 186 lb (84.369 kg)  03/05/14 187 lb 9.8 oz (85.1 kg)  Constitutional: obese, gynoid distribution, no full supraclavicular fat pads, in NAD Eyes: PERRLA, EOMI, no exophthalmos ENT: moist mucous membranes, no thyromegaly, no cervical lymphadenopathy Cardiovascular: RRR, No MRG Respiratory: CTA B Gastrointestinal: abdomen soft, NT, ND, BS+ Musculoskeletal: no deformities, strength intact in all 4 Skin: moist, warm, no rashes Neurological: no tremor with outstretched hands, DTR normal in all 4  ASSESSMENT: 1. FH of Hashimoto's hypothyroidism  2. Obesity - class I - see below: BMI Classification:  < 18.5 underweight   18.5-24.9 normal weight   25.0-29.9 overweight   30.0-34.9 class I obesity   35.0-39.9 class II obesity  ? 40.0 class III obesity   PLAN:  1. Patient with normal thyroid functional and antibody tests, with FH of Hashimoto's hypothyroidism, concerned whether her weight gain can be related to an undiagnosed thyroid problem. She appears euthyroid, but she has many complaints that can be attributed to hypothyroidism (fatigue, weight gain, etc.). She does not appear to have a goiter, thyroid nodules, or neck compression symptoms. - we reviewed all her thyroid tests and I explained that there is no indication that she has a thyroid problem - also, since TSH in the normal range, likely this is not contributing to her weight gain - she had multiple Q's from what she read online regarding treatment of possible thyroid condition even when thyroid tests are normal. I explained that this is not an accepted practice.   2. Obesity - started to gain weight in 2008  - tried phentermine >> did not lose weight - no DM, HTN, but has HL - we discussed about endocrine causes for obesity, to include:   hypothyroidism (thyroid test were normal)  Cushing disease (will check 24-hour urine cortisol level -  per her request)  menopause (she is at menopause age, is taking estrogen, progesterone and DHEAS)  PCOS (no irreg. Menses in the past) I advised her to collect the urine for cortisol. I have low suspicion for Cushing sd. though. I will addend the results when they become available. - discussed the need to find a diet that works for her if lab above returns normal  - time spent with the patient: 1 hour, of which >50% was spent in obtaining information about her symptoms, reviewing her previous labs, evaluations, and treatments, counseling her about her thyroid labs, reviewing together guidelines for tx with thyroid hh, describing endocrine causes for obesity (please see the discussed topics above), and developing a plan to further investigate for this. She had a number of questions which I addressed.  10/28/2014 Pt did not return for further labs.

## 2014-04-22 ENCOUNTER — Telehealth: Payer: Self-pay | Admitting: Neurology

## 2014-04-22 ENCOUNTER — Ambulatory Visit: Payer: Self-pay | Admitting: Neurology

## 2014-04-22 NOTE — Telephone Encounter (Signed)
Pt resch new patient appt to see Dr Allena KatzPatel on 04-22-14 to 06-04-14 due the fact she was sick notified the referring dr of the change

## 2014-04-23 NOTE — Telephone Encounter (Signed)
04/22/14 marked as no show b/c pt called day of to r/s appt but a no show letter will not be sent / Sherri S.

## 2014-05-02 ENCOUNTER — Ambulatory Visit (INDEPENDENT_AMBULATORY_CARE_PROVIDER_SITE_OTHER): Payer: Self-pay

## 2014-05-02 ENCOUNTER — Ambulatory Visit (INDEPENDENT_AMBULATORY_CARE_PROVIDER_SITE_OTHER): Payer: Self-pay | Admitting: Family Medicine

## 2014-05-02 ENCOUNTER — Encounter: Payer: Self-pay | Admitting: Family Medicine

## 2014-05-02 VITALS — BP 104/68 | HR 96 | Ht 64.0 in | Wt 192.0 lb

## 2014-05-02 DIAGNOSIS — M25512 Pain in left shoulder: Secondary | ICD-10-CM

## 2014-05-02 DIAGNOSIS — M7502 Adhesive capsulitis of left shoulder: Secondary | ICD-10-CM | POA: Insufficient documentation

## 2014-05-02 NOTE — Assessment & Plan Note (Signed)
Patient was given an injection in the left shoulder today under ultrasound guidance. We discussed icing protocol as well as home exercise program. We discussed the possibility of formal physical therapy which patient declined. We discussed icing regimen. She will continue with the topical anti-inflammatories. Patient come back in 4 weeks for further evaluation and treatment.

## 2014-05-02 NOTE — Patient Instructions (Signed)
Good to see you Injection in the shoulder Ice 20 minutes 2 times daily. Usually after activity and before bed. Exercises 3 times a week.  Come back in 4 weeks.  Start walking.

## 2014-05-02 NOTE — Progress Notes (Signed)
Tawana ScaleZach Smith D.O. [facility] [address] [place], [place] [address] Phone: [phone] Subjective:    CC: Foot pain, left  OZH:[id number]:Subjective Cindy Stephens is a 48 y.o. female coming in with complaint of foot pain. Patient was found to have a peroneal tendinitis. Patient was doing better with conservative therapy and had started increasing her walking. Patient states, overall her foot is doing much better. Patient has not been walking and has not been doing exercises but is not having any pain at this time.  Patient is having a new problem. Patient is having left shoulder pain. Patient has had this previously and did have a subacromial bursitis previously. Patient did have an injection 5 months ago which didn't completely resolved the pain. Patient had her flu shot approximately 3 weeks ago and that seems to exacerbate the situation. Patient does notice that she has lost some range of motion of the shoulder compared to her other side. Denies any radiation down the arm or any of the numbness and tingling she was having previously.      Past medical history, social, surgical and family history all reviewed in electronic medical record.   Review of Systems: No headache, visual changes, nausea, vomiting, diarrhea, constipation, dizziness, abdominal pain, skin rash, fevers, chills, night sweats, weight loss, swollen lymph nodes, body aches, joint swelling, muscle aches, chest pain, shortness of breath, mood changes.   Objective Blood pressure 104/68, pulse 96, height 5\' 4"  (1.626 m), weight 192 lb (87.091 kg), SpO2 96 %.  General: No apparent distress alert and oriented x3 mood and affect normal, dressed appropriately. Overweight HEENT: Pupils equal, extraocular movements intact  Respiratory: Patient's speak in full sentences and does not appear short of breath  Cardiovascular: No lower extremity edema, non tender, no erythema  Skin: Warm dry intact with no signs of infection or rash  on extremities or on axial skeleton.  Abdomen: Soft nontender  Neuro: Cranial nerves II through XII are intact, neurovascularly intact in all extremities with 2+ DTRs and 2+ pulses.  Lymph: No lymphadenopathy of posterior or anterior cervical chain or axillae bilaterally.  Gait mild antalgic gait  MSK:  Non tender with full range of motion and good stability and symmetric strength and tone of shoulders, elbows, wrist, hip, knee and bilaterally.  Ankle: Left No visible erythema. Range of motion is full in all directions. Strength is 5/5 in all directions. Stable lateral and medial ligaments; squeeze test and kleiger test unremarkable; Talar dome nontender; No pain at base of 5th MT; No tenderness over cuboid; No tenderness over N spot or navicular prominence No tenderness on posterior aspects of lateral and medial malleolus Nontender over the peroneal tendons Able to walk 4 steps. Contralateral ankle unremarkable  Shoulder: left Inspection reveals no abnormalities, atrophy or asymmetry. Palpation is normal with no tenderness over AC joint or bicipital groove. ROM is decreased to 165 of forward flexion, internal rotation to sacrum, external rotation of 10. Rotator cuff strength normal throughout. signs of impingement with positive Neer and Hawkin's tests, but negative empty can sign. Speeds and Yergason's tests normal. No labral pathology noted with negative Obrien's, negative clunk and good stability. Normal scapular function observed. No painful arc and no drop arm sign. No apprehension sign Contralateral shoulder unremarkable  MSK US performed of: left This study was ordered, performed, and interpreted by Terrilee FilesZach Smith D.O.  Shoulder:   Supraspinatus:  Appears normal on long and transverse views, Bursal bulge seen with shoulder abduction on  impingement view. Thickening of the capsule noted Infraspinatus:  Appears normal on long and transverse views. Significant increase in  Doppler flow Subscapularis:  Appears normal on long and transverse views. Positive bursa Teres Minor:  Appears normal on long and transverse views. AC joint:  Capsule undistended, no geyser sign. Glenohumeral Joint:  Appears normal without effusion. Glenoid Labrum:  Intact without visualized tears. Patient does have thickening of the posterior capsule noted. Biceps Tendon:  Appears normal on long and transverse views, no fraying of tendon, tendon located in intertubercular groove, no subluxation with shoulder internal or external rotation.  Impression: Subacromial bursitis with signs of adhesive capsulitis  Procedure: Real-time Ultrasound Guided Injection of left glenohumeral joint Device: GE Logiq E  Ultrasound guided injection is preferred based studies that show increased duration, increased effect, greater accuracy, decreased procedural pain, increased response rate with ultrasound guided versus blind injection.  Verbal informed consent obtained.  Time-out conducted.  Noted no overlying erythema, induration, or other signs of local infection.  Skin prepped in a sterile fashion.  Local anesthesia: Topical Ethyl chloride.  With sterile technique and under real time ultrasound guidance:  Joint visualized.  23g 1  inch needle inserted posterior approach. Pictures taken for needle placement. Patient did have injection of 2 cc of 1% lidocaine, 2 cc of 0.5% Marcaine, and 1.0 cc of Kenalog 40 mg/dL. Completed without difficulty  Pain immediately resolved suggesting accurate placement of the medication.  Advised to call if fevers/chills, erythema, induration, drainage, or persistent bleeding.  Images permanently stored and available for review in the ultrasound unit.  Impression: Technically successful ultrasound guided injection.     Impression and Recommendations:     This case required medical decision making of moderate complexity.

## 2014-05-30 ENCOUNTER — Ambulatory Visit: Payer: Self-pay | Admitting: Family Medicine

## 2014-06-04 ENCOUNTER — Ambulatory Visit (INDEPENDENT_AMBULATORY_CARE_PROVIDER_SITE_OTHER): Payer: Self-pay | Admitting: Neurology

## 2014-06-04 ENCOUNTER — Encounter: Payer: Self-pay | Admitting: Neurology

## 2014-06-04 VITALS — BP 120/78 | HR 90 | Ht 63.5 in | Wt 191.5 lb

## 2014-06-04 DIAGNOSIS — R202 Paresthesia of skin: Secondary | ICD-10-CM

## 2014-06-04 DIAGNOSIS — G5712 Meralgia paresthetica, left lower limb: Secondary | ICD-10-CM

## 2014-06-04 MED ORDER — LIDOCAINE 5 % EX OINT: 1.0000 "application " | TOPICAL_OINTMENT | Freq: Three times a day (TID) | CUTANEOUS | Status: AC | PRN

## 2014-06-04 NOTE — Progress Notes (Signed)
[facility] Clinic Note - Initial Visit   Date: 06/04/2014  Cindy Stephens MRN: [id number] DOB: July 12, 1965   Dear Dr. Etter Sjogren:  Thank you for your kind referral of Cindy Stephens for consultation of disturbance of skin sensation. Although her history is well known to you, please allow [place] to reiterate it for the purpose of our medical record. The patient was accompanied to the clinic by self.    History of Present Illness: Cindy Stephens is a 48 y.o. right-handed Caucasian female with obesity, arthralgias, depression, and GERD presenting for evaluation of left leg pain and foot.    Starting in January 2015, she was driving home from [place], [place] and on the way home felt tingling sensation over the left thigh.  A few weeks later, tingling sensation subsided and she now has constant numbness.  Starting in early December, tingling sensation returning and now involves more of her lateral thigh.  No similar symptoms involving the right leg.  She denies wearing tight clothing or belts. She has gained about 40-50lb in the past 3 years.  She denies any weakness and endorses mild back pain.  She also complains of intermittent paresthesias of her hands especially during the summer months, which would wake her up from sleeping.    Out-side paper records, electronic medical record, and images have been reviewed where available and summarized as:  Labs 11/26/2013:  vitamin B12 580, ESR 6, RF <10, fT3 2.6, fT4 0.6, TSH 1.45  Past Medical History  Diagnosis Date  . Shoulder dislocation 2007    Left Shoulder  . Depression   . GERD (gastroesophageal reflux disease)   . Allergic rhinitis   . Urinary incontinence     Past Surgical History  Procedure Laterality Date  . Elbow surgery Left 2007    elbow was shattered  . Appendectomy       Medications:  Current Outpatient Prescriptions on File Prior to Visit  Medication Sig Dispense Refill  . aspirin 81 MG tablet Take  81 mg by mouth daily.    Marland Kitchen co-enzyme Q-10 30 MG capsule Take 30 mg by mouth 3 (three) times daily.    Marland Kitchen DHEA 10 MG TABS Take by mouth.    . Diclofenac Sodium 2 % SOLN Apply twice daily. 112 g 1  . fluticasone (FLONASE) 50 MCG/ACT nasal spray Place into both nostrils daily.    Marland Kitchen glucosamine-chondroitin 500-400 MG tablet Take 1 tablet by mouth 2 (two) times daily.    Marland Kitchen loratadine (CLARITIN) 10 MG tablet Take 10 mg by mouth daily.    . magnesium oxide (MAG-OX) 400 MG tablet Take 400 mg by mouth daily.    Nathanial Millman Mushroom POWD by Does not apply route.    . Melatonin 10 MG CAPS Take by mouth.    . Misc Natural Products (PROGESTERONE EX) Apply topically daily.    . montelukast (SINGULAIR) 10 MG tablet Take 10 mg by mouth at bedtime.    . naproxen sodium (ANAPROX) 220 MG tablet Take 220 mg by mouth 2 (two) times daily with a meal.    . Omega-3 Fatty Acids (FISH OIL) 1000 MG CAPS Take 1 capsule by mouth.    Marland Kitchen omeprazole (PRILOSEC OTC) 20 MG tablet Take 20 mg by mouth daily.    Marland Kitchen Phenylephrine-DM-GG-APAP (Heron FAST-MAX) 10-20-400-650 MG PACK Take by mouth.    . Potassium 99 MG TABS Take by mouth.    . Probiotic Product (PROBIOTIC DAILY PO) Take by mouth.    Marland Kitchen  pseudoephedrine (SUDAFED) 30 MG tablet Take 30 mg by mouth every 4 (four) hours as needed for congestion.    . Turmeric 500 MG CAPS Take by mouth.    . vitamin B-12 (CYANOCOBALAMIN) 1000 MCG tablet Take 1,000 mcg by mouth daily.     No current facility-administered medications on file prior to visit.    Allergies:  Allergies  Allergen Reactions  . Sulfa Antibiotics Hives and Nausea Only    Family History: Family History  Problem Relation Age of Onset  . Arthritis    . Osteoarthritis Brother   . Arthritis Sister   . Arthritis Mother   . Arthritis Father   . Hyperlipidemia Father   . Alzheimer's disease Father   . Diabetes Paternal Grandmother     PGGM  . Hypertension Father     Paternal Family  . Heart disease       Pateternal Family  . Stroke      Paternal Family    Social History: History   Social History  . Marital Status: Married    Spouse Name: N/A    Number of Children: N/A  . Years of Education: N/A   Occupational History  . Not on file.   Social History Main Topics  . Smoking status: Never Smoker   . Smokeless tobacco: Never Used  . Alcohol Use: 0.0 oz/week    0 Not specified per week     Comment: Occ  . Drug Use: No  . Sexual Activity:    Partners: Male   Other Topics Concern  . Not on file   Social History Narrative   Lives with husband and children in a 2 story home.  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  Education: BS    Review of Systems:  CONSTITUTIONAL: No fevers, chills, night sweats, or weight loss.   EYES: No visual changes or eye pain ENT: No hearing changes.  No history of nose bleeds.   RESPIRATORY: No cough, wheezing and shortness of breath.   CARDIOVASCULAR: Negative for chest pain, and palpitations.   GI: Negative for abdominal discomfort, blood in stools or black stools.  No recent change in bowel habits.   GU:  No history of incontinence.   MUSCLOSKELETAL: No history of joint pain or swelling.  No myalgias.   SKIN: Negative for lesions, rash, and itching.   HEMATOLOGY/ONCOLOGY: Negative for prolonged bleeding, bruising easily, and swollen nodes.  No history of cancer.   ENDOCRINE: Negative for cold or heat intolerance, polydipsia or goiter.   PSYCH:  No depression or anxiety symptoms.   NEURO: As Above.   Vital Signs:  BP 120/78 mmHg  Pulse 90  Ht 5' 3.5" (1.613 m)  Wt 191 lb 8 oz (86.864 kg)  BMI 33.39 kg/m2  SpO2 96%  General Medical Exam:   General:  Well appearing, comfortable.   Eyes/ENT: see cranial nerve examination.   Neck: No masses appreciated.  Full range of motion without tenderness.  No carotid bruits. Respiratory:  Clear to auscultation, good air entry bilaterally.   Cardiac:  Regular rate and rhythm, no murmur.   Extremities:   No deformities, edema, or skin discoloration.  Skin:  No rashes or lesions.  Neurological Exam: MENTAL STATUS including orientation to time, place, person, recent and remote memory, attention span and concentration, language, and fund of knowledge is normal.  Speech is not dysarthric.  CRANIAL NERVES: II:  No visual field defects.  Unremarkable fundi.   III-IV-VI: Pupils equal round and  reactive to light.  Normal conjugate, extra-ocular eye movements in all directions of gaze.  No nystagmus.  No ptosis.   V:  Normal facial sensation.  Jaw jerk is absent.   VII:  Normal facial symmetry and movements.  No pathologic facial reflexes.  VIII:  Normal hearing and vestibular function.   IX-X:  Normal palatal movement.   XI:  Normal shoulder shrug and head rotation.   XII:  Normal tongue strength and range of motion, no deviation or fasciculation.  MOTOR:  No atrophy, fasciculations or abnormal movements.  No pronator drift.  Tone is normal.    Right Upper Extremity:    Left Upper Extremity:    Deltoid  5/5   Deltoid  5/5   Biceps  5/5   Biceps  5/5   Triceps  5/5   Triceps  5/5   Wrist extensors  5/5   Wrist extensors  5/5   Wrist flexors  5/5   Wrist flexors  5/5   Finger extensors  5/5   Finger extensors  5/5   Finger flexors  5/5   Finger flexors  5/5   Dorsal interossei  5/5   Dorsal interossei  5/5   Abductor pollicis  5/5   Abductor pollicis  5/5   Tone (Ashworth scale)  0  Tone (Ashworth scale)  0   Right Lower Extremity:    Left Lower Extremity:    Hip flexors  5/5   Hip flexors  5/5   Hip extensors  5/5   Hip extensors  5/5   Knee flexors  5/5   Knee flexors  5/5   Knee extensors  5/5   Knee extensors  5/5   Dorsiflexors  5/5   Dorsiflexors  5/5   Plantarflexors  5/5   Plantarflexors  5/5   Toe extensors  5/5   Toe extensors  5/5   Toe flexors  5/5   Toe flexors  5/5   Tone (Ashworth scale)  0  Tone (Ashworth scale)  0   MSRs:  Right                                                                  Left brachioradialis 2+  brachioradialis 2+  biceps 2+  biceps 2+  triceps 2+  triceps 2+  patellar 2+  patellar 2+  ankle jerk 2+  ankle jerk 2+  Hoffman no  Hoffman no  plantar response down  plantar response down   SENSORY: Diminished sensation to all modalities over the left anterolateral thigh, otherwise, normal and symmetric perception of light touch, pinprick, vibration, and proprioception.  Romberg's sign absent.   COORDINATION/GAIT: Normal finger-to- nose-finger and heel-to-shin.  Intact rapid alternating movements bilaterally.  Able to rise from a chair without using arms.  Gait narrow based and stable. Tandem and stressed gait intact.    IMPRESSION: Ms. Scovill is a 48 year-old female presenting for evaluation of left thigh paresthesias.  Her examination is consistent with diminished sensation in all modalities over the anterolateral thigh on the left side. 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 Underlying etiology is most likely weight gain causing localizing compression to  the nerve and I stressed the importance of weight loss. I have discussed the diagnosis, pathophysiology, management plan, and prognosis.   Regarding her hand paresthesias, she most likely has nerve entrapment either across the elbow or wrist at bedtime.  Symptoms are not severe or frequent enough to get electrodiagnostic testing at this time.  I encourages her to avoid hyperflexion of the elbow and wrist.   PLAN/RECOMMENDATIONS:  1. Apply lidocaine ointment to thigh for tingling paresthesias 2. Weight loss encouraged 3. Avoid hyperflexion of the wrist and elbows especially at night 4. If hand paresthesias worsen, next step will be EMG 5. Return to clinic as needed   The duration of this appointment visit was 35 minutes of face-to-face time with the patient.  Greater than 50% of  this time was spent in counseling, explanation of diagnosis, planning of further management, and coordination of care.   Thank you for allowing me to participate in patient's care.  If I can answer any additional questions, I would be pleased to do so.    Sincerely,    Donika K. Posey Pronto, DO

## 2014-06-04 NOTE — Patient Instructions (Addendum)
1.  Start lidocaine ointment to left thigh 2.  Weight loss encouraged 3.  Avoid hyperflexion of the wrist and elbows especially when sleeping 4.  Return to clinic as needed

## 2014-08-19 ENCOUNTER — Ambulatory Visit: Payer: Self-pay | Admitting: Family Medicine

## 2014-08-19 ENCOUNTER — Ambulatory Visit (INDEPENDENT_AMBULATORY_CARE_PROVIDER_SITE_OTHER): Payer: Self-pay | Admitting: Family Medicine

## 2014-08-19 ENCOUNTER — Encounter: Payer: Self-pay | Admitting: Family Medicine

## 2014-08-19 VITALS — BP 110/68 | HR 93 | Temp 97.8°F | Wt 197.7 lb

## 2014-08-19 DIAGNOSIS — F32A Depression, unspecified: Secondary | ICD-10-CM | POA: Insufficient documentation

## 2014-08-19 DIAGNOSIS — F329 Major depressive disorder, single episode, unspecified: Secondary | ICD-10-CM

## 2014-08-19 DIAGNOSIS — F908 Attention-deficit hyperactivity disorder, other type: Secondary | ICD-10-CM

## 2014-08-19 DIAGNOSIS — F988 Other specified behavioral and emotional disorders with onset usually occurring in childhood and adolescence: Secondary | ICD-10-CM

## 2014-08-19 DIAGNOSIS — F900 Attention-deficit hyperactivity disorder, predominantly inattentive type: Secondary | ICD-10-CM

## 2014-08-19 MED ORDER — BUPROPION HCL ER (XL) 150 MG PO TB24: 150.0000 mg | ORAL_TABLET | Freq: Every day | ORAL | Status: DC

## 2014-08-19 MED ORDER — METHYLPHENIDATE HCL 10 MG PO TABS: 10.0000 mg | ORAL_TABLET | Freq: Two times a day (BID) | ORAL | Status: DC

## 2014-08-19 MED ORDER — FLUOXETINE HCL 10 MG PO CAPS: 10.0000 mg | ORAL_CAPSULE | Freq: Every day | ORAL | Status: DC

## 2014-08-19 NOTE — Assessment & Plan Note (Signed)
Restart prozac and wellbutrin rto 1 month

## 2014-08-19 NOTE — Assessment & Plan Note (Signed)
Ritalin 10 mg bid Pt will get records sent to us from previous psych

## 2014-08-19 NOTE — Progress Notes (Signed)
Pre visit review using our clinic review tool, if applicable. No additional management support is needed unless otherwise documented below in the visit note. 

## 2014-08-19 NOTE — Progress Notes (Signed)
Patient ID: Cindy Stephens, female    DOB: [birth year]  Age: 49 y.o. MRN: [id number]    Subjective:  Subjective HPI Cindy Stephens presents to discuss depression and ADD.  She has test results from psych from a few years ago.  She states she used to be on ritalin/ adderall and was taking prozac and wellbutrin for depression.  She is interested in restarting them.   Review of Systems  Constitutional: Negative for activity change, appetite change, fatigue and unexpected weight change.  Respiratory: Negative for cough and shortness of breath.   Cardiovascular: Negative for chest pain and palpitations.  Psychiatric/Behavioral: Positive for dysphoric mood and decreased concentration. Negative for suicidal ideas, behavioral problems, sleep disturbance and self-injury. The patient is nervous/anxious.     History Past Medical History  Diagnosis Date  . Shoulder dislocation 2007    Left Shoulder  . Depression   . GERD (gastroesophageal reflux disease)   . Allergic rhinitis   . Urinary incontinence     She has past surgical history that includes Elbow surgery (Left, 2007) and Appendectomy.   Her family history includes Alzheimer's disease in her father; Arthritis in her father, mother, sister, and another family member; Diabetes in her paternal grandmother; Heart disease in an other family member; Hyperlipidemia in her father; Hypertension in her father; Osteoarthritis in her brother; Stroke in an other family member.She reports that she has never smoked. She has never used smokeless tobacco. She reports that she drinks alcohol. She reports that she does not use illicit drugs.  Current Outpatient Prescriptions on File Prior to Visit  Medication Sig Dispense Refill  . aspirin 81 MG tablet Take 81 mg by mouth daily.    Marland Kitchen co-enzyme Q-10 30 MG capsule Take 30 mg by mouth 3 (three) times daily.    Marland Kitchen DHEA 10 MG TABS Take by mouth.    . fluticasone (FLONASE) 50 MCG/ACT nasal spray Place into both  nostrils daily.    Marland Kitchen glucosamine-chondroitin 500-400 MG tablet Take 1 tablet by mouth 2 (two) times daily.    Marland Kitchen lidocaine (XYLOCAINE) 5 % ointment Apply 1 application topically 3 (three) times daily as needed. Use gloves when applying to left leg. 35.44 g 3  . loratadine (CLARITIN) 10 MG tablet Take 10 mg by mouth daily.    . magnesium oxide (MAG-OX) 400 MG tablet Take 400 mg by mouth daily.    Carlene Coria Mushroom POWD by Does not apply route.    . Melatonin 10 MG CAPS Take by mouth.    . Misc Natural Products (PROGESTERONE EX) Apply topically daily.    . montelukast (SINGULAIR) 10 MG tablet Take 10 mg by mouth at bedtime.    . Omega-3 Fatty Acids (FISH OIL) 1000 MG CAPS Take 1 capsule by mouth.    Marland Kitchen omeprazole (PRILOSEC OTC) 20 MG tablet Take 20 mg by mouth daily.    Marland Kitchen Phenylephrine-DM-GG-APAP (MUCINEX FAST-MAX) 10-20-400-650 MG PACK Take by mouth.    . Potassium 99 MG TABS Take by mouth.    . Probiotic Product (PROBIOTIC DAILY PO) Take by mouth.    . pseudoephedrine (SUDAFED) 30 MG tablet Take 30 mg by mouth every 4 (four) hours as needed for congestion.    Marland Kitchen TAZORAC 0.05 % cream   5  . Turmeric 500 MG CAPS Take by mouth.    . vitamin B-12 (CYANOCOBALAMIN) 1000 MCG tablet Take 1,000 mcg by mouth daily.    . Diclofenac Sodium 2 % SOLN Apply twice  daily. (Patient not taking: Reported on 08/19/2014) 112 g 1   No current facility-administered medications on file prior to visit.     Objective:  Objective Physical Exam  Constitutional: She is oriented to person, place, and time. She appears well-developed and well-nourished. No distress.  HENT:  Right Ear: External ear normal.  Left Ear: External ear normal.  Nose: Nose normal.  Mouth/Throat: Oropharynx is clear and moist.  Eyes: EOM are normal. Pupils are equal, round, and reactive to light.  Neck: Normal range of motion. Neck supple.  Cardiovascular: Normal rate, regular rhythm and normal heart sounds.   No murmur  heard. Pulmonary/Chest: Effort normal and breath sounds normal. No respiratory distress. She has no wheezes. She has no rales. She exhibits no tenderness.  Neurological: She is alert and oriented to person, place, and time.  Psychiatric: Her speech is normal and behavior is normal. Judgment and thought content normal. Her mood appears anxious. Her affect is not angry, not blunt, not labile and not inappropriate. She exhibits a depressed mood.   BP 110/68 mmHg  Pulse 93  Temp(Src) 97.8 F (36.6 C) (Oral)  Wt 197 lb 10.4 oz (89.653 kg)  SpO2 96%  LMP 07/30/2014 Wt Readings from Last 3 Encounters:  08/19/14 197 lb 10.4 oz (89.653 kg)  06/04/14 191 lb 8 oz (86.864 kg)  05/02/14 192 lb (87.091 kg)     Lab Results  Component Value Date   WBC 4.0 11/12/2013   HGB 12.6 11/12/2013   HCT 36.4 11/12/2013   PLT 188.0 11/12/2013   GLUCOSE 107* 03/05/2014   CHOL 190 03/05/2014   TRIG 198.0* 03/05/2014   HDL 34.20* 03/05/2014   LDLCALC 116* 03/05/2014   ALT 19 03/05/2014   AST 21 03/05/2014   NA 134* 03/05/2014   K 4.0 03/05/2014   CL 102 03/05/2014   CREATININE 0.8 03/05/2014   BUN 18 03/05/2014   CO2 27 03/05/2014   TSH 1.45 11/12/2013    No results found.   Assessment & Plan:  Plan I have discontinued Cindy Stephens's naproxen sodium. I am also having her start on buPROPion, FLUoxetine, and methylphenidate. Additionally, I am having her maintain her loratadine, pseudoephedrine, Phenylephrine-DM-GG-APAP, omeprazole, fluticasone, glucosamine-chondroitin, Fish Oil, Melatonin, DHEA, aspirin, co-enzyme Q-10, vitamin B-12, montelukast, Diclofenac Sodium, Probiotic Product (PROBIOTIC DAILY PO), Potassium, magnesium oxide, Turmeric, [place] Mushroom, Misc Natural Products (PROGESTERONE EX), TAZORAC, lidocaine, and NIACIN CR PO.  Meds ordered this encounter  Medications  . NIACIN CR PO    Sig: Take by mouth.  Marland Kitchen. buPROPion (WELLBUTRIN XL) 150 MG 24 hr tablet    Sig: Take 1 tablet (150 mg  total) by mouth daily. X 1 week then increase to 2 a day    Dispense:  60 tablet    Refill:  0  . FLUoxetine (PROZAC) 10 MG capsule    Sig: Take 1 capsule (10 mg total) by mouth daily.    Dispense:  30 capsule    Refill:  5  . methylphenidate (RITALIN) 10 MG tablet    Sig: Take 1 tablet (10 mg total) by mouth 2 (two) times daily.    Dispense:  60 tablet    Refill:  0    Problem List Items Addressed This Visit    None    Visit Diagnoses    Depression    -  Primary    Relevant Medications    buPROPion (WELLBUTRIN XL) 24 hr tablet    FLUoxetine (PROZAC) capsule    Attention-deficit  hyperactivity disorder, other type           Follow-up: Return in about 4 weeks (around 09/16/2014), or if symptoms worsen or fail to improve, for add and depression.  Loreen Freud, DO

## 2014-08-19 NOTE — Patient Instructions (Signed)

## 2014-08-28 ENCOUNTER — Telehealth: Payer: Self-pay | Admitting: Family Medicine

## 2014-08-28 NOTE — Telephone Encounter (Signed)
Caller name: Aveleen Relation to pt: self Call back number: [phone] Pharmacy:  Reason for call:   Patient states that she has run into a stumbling block on getting her previous records regarding depression and want to talk it over on how to proceed.

## 2014-08-28 NOTE — Telephone Encounter (Signed)
Please advise 

## 2014-09-16 ENCOUNTER — Ambulatory Visit: Payer: Self-pay | Admitting: Family Medicine

## 2014-09-17 ENCOUNTER — Telehealth: Payer: Self-pay | Admitting: Internal Medicine

## 2014-09-17 NOTE — Telephone Encounter (Signed)
Faxed over medical records to [facility] in [place] and also left a message stating that pt can come pick up her records to cause she wanted a copy for herself

## 2014-09-17 NOTE — Telephone Encounter (Signed)
Records received and forwarded to Kim/Dr.Lowne. JG//CMA

## 2014-09-19 ENCOUNTER — Ambulatory Visit (INDEPENDENT_AMBULATORY_CARE_PROVIDER_SITE_OTHER): Payer: Self-pay | Admitting: Family Medicine

## 2014-09-19 ENCOUNTER — Encounter: Payer: Self-pay | Admitting: Family Medicine

## 2014-09-19 VITALS — BP 110/72 | HR 82 | Temp 99.2°F | Resp 16 | Ht 63.5 in | Wt 194.0 lb

## 2014-09-19 DIAGNOSIS — F909 Attention-deficit hyperactivity disorder, unspecified type: Secondary | ICD-10-CM

## 2014-09-19 DIAGNOSIS — F329 Major depressive disorder, single episode, unspecified: Secondary | ICD-10-CM

## 2014-09-19 DIAGNOSIS — F900 Attention-deficit hyperactivity disorder, predominantly inattentive type: Secondary | ICD-10-CM

## 2014-09-19 DIAGNOSIS — F988 Other specified behavioral and emotional disorders with onset usually occurring in childhood and adolescence: Secondary | ICD-10-CM

## 2014-09-19 DIAGNOSIS — F32A Depression, unspecified: Secondary | ICD-10-CM

## 2014-09-19 MED ORDER — METHYLPHENIDATE HCL 20 MG PO TABS: 20.0000 mg | ORAL_TABLET | Freq: Two times a day (BID) | ORAL | Status: DC

## 2014-09-19 MED ORDER — BUPROPION HCL ER (XL) 300 MG PO TB24: 300.0000 mg | ORAL_TABLET | Freq: Every day | ORAL | Status: DC

## 2014-09-19 MED ORDER — FLUOXETINE HCL 10 MG PO CAPS: 10.0000 mg | ORAL_CAPSULE | Freq: Every day | ORAL | Status: DC

## 2014-09-19 NOTE — Assessment & Plan Note (Signed)
Refill ritalin at 20 mg  rto 6 months

## 2014-09-19 NOTE — Assessment & Plan Note (Signed)
con't wellbutrin and prozac

## 2014-09-19 NOTE — Patient Instructions (Signed)

## 2014-09-19 NOTE — Progress Notes (Signed)
Patient ID: Cindy Stephens, female    DOB: Jul 30, 1965  Age: 49 y.o. MRN: [id number]    Subjective:  Subjective HPI Da R Regis presents for f/u depression / adhd since late 90s.  She has been on antidepressants several times.  She is doing much better now.   Review of Systems  Constitutional: Negative for activity change, appetite change, fatigue and unexpected weight change.  Respiratory: Negative for cough and shortness of breath.   Cardiovascular: Negative for chest pain and palpitations.  Psychiatric/Behavioral: Positive for dysphoric mood and decreased concentration. Negative for suicidal ideas, hallucinations, behavioral problems, confusion, sleep disturbance, self-injury and agitation. The patient is not nervous/anxious and is not hyperactive.     History Past Medical History  Diagnosis Date  . Shoulder dislocation 2007    Left Shoulder  . Depression   . GERD (gastroesophageal reflux disease)   . Allergic rhinitis   . Urinary incontinence     She has past surgical history that includes Elbow surgery (Left, 2007) and Appendectomy.   Her family history includes Alzheimer's disease in her father; Arthritis in her father, mother, sister, and another family member; Diabetes in her paternal grandmother; Heart disease in an other family member; Hyperlipidemia in her father; Hypertension in her father; Osteoarthritis in her brother; Stroke in an other family member.She reports that she has never smoked. She has never used smokeless tobacco. She reports that she drinks alcohol. She reports that she does not use illicit drugs.  Current Outpatient Prescriptions on File Prior to Visit  Medication Sig Dispense Refill  . aspirin 81 MG tablet Take 81 mg by mouth daily.    Marland Kitchen. co-enzyme Q-10 30 MG capsule Take 30 mg by mouth 3 (three) times daily.    Marland Kitchen. DHEA 10 MG TABS Take by mouth.    . fluticasone (FLONASE) 50 MCG/ACT nasal spray Place into both nostrils daily.    Marland Kitchen. glucosamine-chondroitin  500-400 MG tablet Take 1 tablet by mouth 2 (two) times daily.    Marland Kitchen. lidocaine (XYLOCAINE) 5 % ointment Apply 1 application topically 3 (three) times daily as needed. Use gloves when applying to left leg. 35.44 g 3  . loratadine (CLARITIN) 10 MG tablet Take 10 mg by mouth daily.    . magnesium oxide (MAG-OX) 400 MG tablet Take 400 mg by mouth daily.    Carlene Coria. [place] Mushroom POWD by Does not apply route.    . Melatonin 10 MG CAPS Take by mouth.    . Misc Natural Products (PROGESTERONE EX) Apply topically daily.    . montelukast (SINGULAIR) 10 MG tablet Take 10 mg by mouth at bedtime.    Marland Kitchen. NIACIN CR PO Take by mouth.    . Omega-3 Fatty Acids (FISH OIL) 1000 MG CAPS Take 1 capsule by mouth.    Marland Kitchen. omeprazole (PRILOSEC OTC) 20 MG tablet Take 20 mg by mouth daily.    . Potassium 99 MG TABS Take by mouth.    . Probiotic Product (PROBIOTIC DAILY PO) Take by mouth.    . pseudoephedrine (SUDAFED) 30 MG tablet Take 30 mg by mouth every 4 (four) hours as needed for congestion.    . Turmeric 500 MG CAPS Take by mouth.    . vitamin B-12 (CYANOCOBALAMIN) 1000 MCG tablet Take 1,000 mcg by mouth daily.    . Diclofenac Sodium 2 % SOLN Apply twice daily. (Patient not taking: Reported on 08/19/2014) 112 g 1  . TAZORAC 0.05 % cream   5   No current  facility-administered medications on file prior to visit.     Objective:  Objective Physical Exam  Constitutional: She is oriented to person, place, and time. She appears well-developed and well-nourished. No distress.  HENT:  Right Ear: External ear normal.  Left Ear: External ear normal.  Nose: Nose normal.  Mouth/Throat: Oropharynx is clear and moist.  Eyes: EOM are normal. Pupils are equal, round, and reactive to light.  Neck: Normal range of motion. Neck supple.  Cardiovascular: Normal rate, regular rhythm and normal heart sounds.   No murmur heard. Pulmonary/Chest: Effort normal and breath sounds normal. No respiratory distress. She has no wheezes. She has no  rales. She exhibits no tenderness.  Neurological: She is alert and oriented to person, place, and time.  Psychiatric: Her speech is normal and behavior is normal. Judgment and thought content normal. Her affect is not blunt, not labile and not inappropriate. Cognition and memory are normal. She exhibits a depressed mood.   BP 110/72 mmHg  Pulse 82  Temp(Src) 99.2 F (37.3 C) (Oral)  Resp 16  Ht 5' 3.5" (1.613 m)  Wt 194 lb (87.998 kg)  BMI 33.82 kg/m2  SpO2 98%  LMP 09/19/2014 Wt Readings from Last 3 Encounters:  09/19/14 194 lb (87.998 kg)  08/19/14 197 lb 10.4 oz (89.653 kg)  06/04/14 191 lb 8 oz (86.864 kg)     Lab Results  Component Value Date   WBC 4.0 11/12/2013   HGB 12.6 11/12/2013   HCT 36.4 11/12/2013   PLT 188.0 11/12/2013   GLUCOSE 107* 03/05/2014   CHOL 190 03/05/2014   TRIG 198.0* 03/05/2014   HDL 34.20* 03/05/2014   LDLCALC 116* 03/05/2014   ALT 19 03/05/2014   AST 21 03/05/2014   NA 134* 03/05/2014   K 4.0 03/05/2014   CL 102 03/05/2014   CREATININE 0.8 03/05/2014   BUN 18 03/05/2014   CO2 27 03/05/2014   TSH 1.45 11/12/2013    No results found.   Assessment & Plan:  Plan I have discontinued Ms. Dayley Phenylephrine-DM-GG-APAP and methylphenidate. I am also having her start on methylphenidate, methylphenidate, and methylphenidate. Additionally, I am having her maintain her loratadine, pseudoephedrine, omeprazole, fluticasone, glucosamine-chondroitin, Fish Oil, Melatonin, DHEA, aspirin, co-enzyme Q-10, vitamin B-12, montelukast, Diclofenac Sodium, Probiotic Product (PROBIOTIC DAILY PO), Potassium, magnesium oxide, Turmeric, [place] Mushroom, Misc Natural Products (PROGESTERONE EX), TAZORAC, lidocaine, NIACIN CR PO, buPROPion, and FLUoxetine.  Meds ordered this encounter  Medications  . methylphenidate (RITALIN) 20 MG tablet    Sig: Take 1 tablet (20 mg total) by mouth 2 (two) times daily.    Dispense:  60 tablet    Refill:  0  . methylphenidate  (RITALIN) 20 MG tablet    Sig: Take 1 tablet (20 mg total) by mouth 2 (two) times daily.    Dispense:  60 tablet    Refill:  0    Do not fill until Oct 19, 2014  . methylphenidate (RITALIN) 20 MG tablet    Sig: Take 1 tablet (20 mg total) by mouth 2 (two) times daily with breakfast and lunch.    Dispense:  60 tablet    Refill:  0    Do not fill until November 19, 2014  . DISCONTD: buPROPion (WELLBUTRIN XL) 300 MG 24 hr tablet    Sig: Take 1 tablet (300 mg total) by mouth daily.    Dispense:  30 tablet    Refill:  5  . buPROPion (WELLBUTRIN XL) 300 MG 24 hr tablet  Sig: Take 1 tablet (300 mg total) by mouth daily.    Dispense:  90 tablet    Refill:  3  . FLUoxetine (PROZAC) 10 MG capsule    Sig: Take 1 capsule (10 mg total) by mouth daily.    Dispense:  90 capsule    Refill:  3    Problem List Items Addressed This Visit    ADD (attention deficit disorder) without hyperactivity    Refill ritalin at 20 mg  rto 6 months      Depression    con't wellbutrin and prozac      Relevant Medications   buPROPion (WELLBUTRIN XL) 24 hr tablet   FLUoxetine (PROZAC) capsule    Other Visit Diagnoses    ADD (attention deficit disorder)    -  Primary    Relevant Medications    methylphenidate (RITALIN) tablet    methylphenidate (RITALIN) tablet    methylphenidate (RITALIN) tablet       Follow-up: Return in about 6 months (around 03/21/2015), or if symptoms worsen or fail to improve, for f/u add and depression.  Loreen Freud, DO

## 2014-09-19 NOTE — Progress Notes (Signed)
Pre visit review using our clinic review tool, if applicable. No additional management support is needed unless otherwise documented below in the visit note. 

## 2014-10-10 ENCOUNTER — Encounter: Payer: Self-pay | Admitting: Family Medicine

## 2014-12-17 ENCOUNTER — Encounter: Payer: Self-pay | Admitting: Family Medicine

## 2014-12-17 ENCOUNTER — Other Ambulatory Visit (INDEPENDENT_AMBULATORY_CARE_PROVIDER_SITE_OTHER): Payer: Self-pay

## 2014-12-17 ENCOUNTER — Ambulatory Visit (INDEPENDENT_AMBULATORY_CARE_PROVIDER_SITE_OTHER): Payer: Self-pay | Admitting: Family Medicine

## 2014-12-17 ENCOUNTER — Other Ambulatory Visit: Payer: Self-pay

## 2014-12-17 VITALS — BP 116/70 | HR 93 | Ht 63.5 in | Wt 189.0 lb

## 2014-12-17 DIAGNOSIS — M25561 Pain in right knee: Secondary | ICD-10-CM

## 2014-12-17 DIAGNOSIS — M7051 Other bursitis of knee, right knee: Secondary | ICD-10-CM | POA: Insufficient documentation

## 2014-12-17 MED ORDER — DICLOFENAC SODIUM 2 % TD SOLN: TRANSDERMAL | Status: DC

## 2014-12-17 MED ORDER — MELOXICAM 15 MG PO TABS: 15.0000 mg | ORAL_TABLET | Freq: Every day | ORAL | Status: DC

## 2014-12-17 NOTE — Progress Notes (Signed)
[name] [address] [place], [place] [address] Phone: [phone] Subjective:    I'm seeing this patient by the request  of:  Loreen Freud, DO   CC: right knee pain  ION:[id number] Cindy Stephens is a 49 y.o. female coming in with complaint of right knee pain. Patient states that she did notice an episode when she was going down stairs where she extended her knee and had severe pain on the posterior aspect of his leg. Patient then states over the course of 2 weeks it seemed to be painful but started to improve. Patient just back from a 3 week trip where she had been hiking and since then she is having more discomfort in the knee again. Mostly on the lateral aspect of the knee. Patient was seen by another provider in [place] and was concern for meniscal injury. Patient denies any locking, or giving out on her. Denies any radiation. States that is just more of a soreness.     Past Medical History  Diagnosis Date  . Shoulder dislocation 2007    Left Shoulder  . Depression   . GERD (gastroesophageal reflux disease)   . Allergic rhinitis   . Urinary incontinence    Past Surgical History  Procedure Laterality Date  . Elbow surgery Left 2007    elbow was shattered  . Appendectomy     History  Substance Use Topics  . Smoking status: Never Smoker   . Smokeless tobacco: Never Used  . Alcohol Use: 0.0 oz/week    0 Standard drinks or equivalent per week     Comment: Occ   Allergies  Allergen Reactions  . Sulfa Antibiotics Hives and Nausea Only     Past medical history, social, surgical and family history all reviewed in electronic medical record.   Review of Systems: No headache, visual changes, nausea, vomiting, diarrhea, constipation, dizziness, abdominal pain, skin rash, fevers, chills, night sweats, weight loss, swollen lymph nodes, body aches, joint swelling, muscle aches, chest pain, shortness of breath, mood changes.   Objective Blood  pressure 116/70, pulse 93, height 5' 3.5" (1.613 m), weight 189 lb (85.73 kg), SpO2 98 %.  General: No apparent distress alert and oriented x3 mood and affect normal, dressed appropriately.  HEENT: Pupils equal, extraocular movements intact  Respiratory: Patient's speak in full sentences and does not appear short of breath  Cardiovascular: No lower extremity edema, non tender, no erythema  Skin: Warm dry intact with no signs of infection or rash on extremities or on axial skeleton.  Abdomen: Soft nontender  Neuro: Cranial nerves II through XII are intact, neurovascularly intact in all extremities with 2+ DTRs and 2+ pulses.  Lymph: No lymphadenopathy of posterior or anterior cervical chain or axillae bilaterally.  Gait normal with good balance and coordination.  MSK:  Non tender with full range of motion and good stability and symmetric strength and tone of shoulders, elbows, wrist, hip, and ankles bilaterally.  Knee: Right Normal to inspection with no erythema or effusion or obvious bony abnormalities. Tender over the posterior lateral joint line and laterally over the popliteal fossa ROM full in flexion and extension and lower leg rotation. Ligaments with solid consistent endpoints including ACL, PCL, LCL, MCL. Negative Mcmurray's, Apley's, and Thessalonian tests. Mild painful patellar compression Patellar glide with crepitus. Patellar and quadriceps tendons unremarkable. Hamstring and quadriceps strength is normal.   MSK US performed of: Right knee This study was ordered, performed, and interpreted by Ian Malkin  Smith D.O.  Knee: All structures visualized. Anteromedial, anterolateral, posteromedial, and posterolateral menisci unremarkable without tearing, fraying, effusion, or displacement. Mild narrowing of the superior lateral joint space. Significant hypoechoic changes around the popliteal tendon increase in Doppler flow noted as well. Patellar Tendon unremarkable on long and transverse  views without effusion. No abnormality of prepatellar bursa. LCL and MCL unremarkable on long and transverse views. No abnormality of origin of medial or lateral head of the gastrocnemius.  IMPRESSION:  Popliteal subluxation with tendinitis    Impression and Recommendations:     This case required medical decision making of moderate complexity.

## 2014-12-17 NOTE — Assessment & Plan Note (Signed)
Patient will do a short course of oral anti-implant was, icing protocol, discussed over-the-counter compression sleeve, work with athletic trainer to learn home exercises. Patient come back and see me again in 3-4 weeks for further evaluation and treatment.

## 2014-12-17 NOTE — Progress Notes (Signed)
Pre visit review using our clinic review tool, if applicable. No additional management support is needed unless otherwise documented below in the visit note. 

## 2014-12-17 NOTE — Patient Instructions (Addendum)
Good to see you Ice atleast 2x/day, after activity and before bed Try a compression sleeve for your knee - McDavid/Tommy Copper Exercises 3x/week Take the Meloxicam for 10days to help calm things down See me again in 3 weeks.    Popliteus Tendinitis with Rehab Tendonitis is a condition that is characterized by inflammation of a tendon. A tendon is the soft tissue that connects muscles to the skeletal system allowing for body movements. Popliteus tendonitis affects the popliteus tendon, which connects the popliteus muscle to the thigh bone (femur) near the knee. The popliteus muscle helps bend and rotate the knee. Popliteus tendonitis is often caused by a tendon tear (strain). Strains are classified into three categories. Grade 1 strains cause pain, but the tendon is not lengthened. Grade 2 strains include a lengthened ligament due to the ligament being stretched or partially ruptured. With grade 2 strains there is still function, although the function may be diminished. Grade 3 strains are characterized by a complete tear of the tendon or muscle, and function is usually impaired. SYMPTOMS   Pain in the knee, specifically the outer (lateral) and back (posterior) portions.  Pain that worsens with use of the popliteus muscle (standing on a slightly bent knee or rotating the knee).  A crackling sound (crepitation) when the tendon is moved or touched (uncommon, except when tested just after exercising). CAUSES Popliteus tendonitis occurs when damage to the popliteus tendon elicits an inflammatory (healing) response. Popliteus tendonitis is often an overuse injury. RISK INCREASES WITH:  Activities that require extensive running or walking downhill.  Poor strength and flexibility.  Failure to warm-up properly before activity.  Flat feet. PREVENTION  Warm up and stretch properly before activity.  Allow for adequate recovery between workouts.  Maintain physical fitness:  Strength,  flexibility, and endurance.  Cardiovascular fitness.  Learn and implement proper training regimens and sports technique.  Arch supports (orthotics) for individuals with flat feet. PROGNOSIS  If treated properly, then the symptoms of popliteus tendonitis usually resolve within 6 weeks.  RELATED COMPLICATIONS   Prolonged healing time, if improperly treated or re-injured.  Recurrent symptoms that result in a chronic problem. TREATMENT  Treatment initially involves the use of ice and medication to help reduce pain and inflammation. The use of strengthening and stretching exercises may help reduce pain with activity. These exercises may be performed at home or with referral to a therapist. Many individuals find that the use of a compression bandage or a knee sleeve helps reduce symptoms. If you have flat feet, then your caregiver may recommend arch supports. It is important to learn/modify techniques for running uphill/downhill that do not aggravate your symptoms. If symptoms persist for greater than 6 months despite conservative (non-surgical) treatment, then surgery may be recommended to remove the tendon sheath (lining).  MEDICATION   If pain medication is necessary, then nonsteroidal anti-inflammatory medications, such as aspirin and ibuprofen, or other minor pain relievers, such as acetaminophen, are often recommended.  Do not take pain medication within 7 days before surgery.  Prescription pain relievers may be given if deemed necessary by your caregiver. Use only as directed and only as much as you need. HEAT AND COLD  Cold treatment (icing) relieves pain and reduces inflammation. Cold treatment should be applied for 10 to 15 minutes every 2 to 3 hours for inflammation and pain and immediately after any activity that aggravates your symptoms. Use ice packs or massage the area with a piece of ice (ice massage).  Heat treatment  may be used prior to performing the stretching and  strengthening activities prescribed by your caregiver, physical therapist, or athletic trainer. Use a heat pack or soak the injury in warm water. SEEK MEDICAL CARE IF:  Treatment seems to offer no benefit, or the condition worsens.  Any medications produce adverse side effects. EXERCISES RANGE OF MOTION (ROM) AND STRETCHING EXERCISES - Popliteus Tendinitis These exercises may help you when beginning to rehabilitate your injury. Your symptoms may resolve with or without further involvement from your physician, physical therapist or athletic trainer. While completing these exercises, remember:   Restoring tissue flexibility helps normal motion to return to the joints. This allows healthier, less painful movement and activity.  An effective stretch should be held for at least 30 seconds.  A stretch should never be painful. You should only feel a gentle lengthening or release in the stretched tissue. STRETCH - Gastroc, Standing   Place hands on wall.  Extend right / left leg, keeping the front knee somewhat bent.  Slightly point your toes inward on your back foot.  Keeping your right / left heel on the floor and your knee straight, shift your weight toward the wall, not allowing your back to arch.  You should feel a gentle stretch in the right / left calf. Hold this position for __________ seconds. Repeat __________ times. Complete this stretch __________ times per day. STRETCH - Soleus, Standing   Place hands on wall.  Extend right / left leg, keeping the other knee somewhat bent.  Slightly point your toes inward on your back foot.  Keep your right / left heel on the floor, bend your back knee, and slightly shift your weight over the back leg so that you feel a gentle stretch deep in your back calf.  Hold this position for __________ seconds. Repeat __________ times. Complete this stretch __________ times per day. STRETCH - Gastrocsoleus, Standing  Note: This exercise can place a  lot of stress on your foot and ankle. Please complete this exercise only if specifically instructed by your caregiver.   Place the ball of your right / left foot on a step, keeping your other foot firmly on the same step.  Hold on to the wall or a rail for balance.  Slowly lift your other foot, allowing your body weight to press your heel down over the edge of the step.  You should feel a stretch in your right / left calf.  Hold this position for __________ seconds.  Repeat this exercise with a slight bend in your right / left knee. Repeat __________ times. Complete this stretch __________ times per day.  STRETCH - Hamstrings, Standing  Stand or sit and extend your right / left leg, placing your foot on a chair or foot stool  Keeping a slight arch in your low back and your hips straight forward.  Lead with your chest and lean forward at the waist until you feel a gentle stretch in the back of your right / left knee or thigh. (When done correctly, this exercise requires leaning only a small distance.)  Hold this position for __________ seconds. Repeat __________ times. Complete this stretch __________ times per day. STRETCH - Hamstrings, Supine   Lie on your back. Loop a belt or towel over the ball of your right / left foot.  Straighten your right / left knee and slowly pull on the belt to raise your leg. Do not allow the right / left knee to bend. Keep your opposite  leg flat on the floor.  Raise the leg until you feel a gentle stretch behind your right / left knee or thigh. Hold this position for __________ seconds. Repeat __________ times. Complete this stretch __________ times per day.  STRETCH - Hamstrings, Doorway  Lie on your back with your right / left leg extended and resting on the wall and the opposite leg flat on the ground through the door. Initially, position your bottom farther away from the wall than the illustration shows.  Keep your right / left knee straight. If  you feel a stretch behind your knee or thigh, hold this position for __________ seconds.  If you do not feel a stretch, scoot your bottom closer to the door, and hold __________ seconds. Repeat __________ times. Complete this stretch __________ times per day.  STRETCH - Quadriceps, Prone   Lie on your stomach on a firm surface, such as a bed or padded floor.  Bend your right / left knee and grasp your ankle. If you are unable to reach, your ankle or pant leg, use a belt around your foot to lengthen your reach.  Gently pull your heel toward your buttocks. Your knee should not slide out to the side. You should feel a stretch in the front of your thigh and/or knee.  Hold this position for __________ seconds. Repeat __________ times. Complete this stretch __________ times per day.  STRENGTHENING EXERCISES - Popliteus Tendinitis These exercises may help you when beginning to rehabilitate your injury. They may resolve your symptoms with or without further involvement from your physician, physical therapist or athletic trainer. While completing these exercises, remember:   Muscles can gain both the endurance and the strength needed for everyday activities through controlled exercises.  Complete these exercises as instructed by your physician, physical therapist or athletic trainer. Progress the resistance and repetitions only as guided. STRENGTH - Hamstring, Isometrics   Lie on your back on a firm surface.  Bend your right / left knee approximately __________ degrees.  Dig your heel into the surface as if you are trying to pull it toward your buttocks. Tighten the muscles in the back of your thighs to "dig" as hard as you can without increasing any pain.  Hold this position for __________ seconds.  Release the tension gradually and allow your muscle to completely relax for __________ seconds in between each exercise. Repeat __________ times. Complete this exercise __________ times per day.   STRENGTH - Hamstring, Curls   Lay on your stomach with your legs extended. (If you lay on a bed, your feet may hang over the edge.)  Tighten the muscles in the back of your thigh to bend your right / left knee up to 90 degrees. Keep your hips flat on the bed/floor.  Hold this position for __________ seconds.  Slowly lower your leg back to the starting position. Repeat __________ times. Complete this exercise __________ times per day.  OPTIONAL ANKLE WEIGHTS: Begin with ____________________, but DO NOT exceed ____________________. Increase in1 lb/0.5 kg increments. Document Released: 05/31/2005 Document Revised: 08/23/2011 Document Reviewed: 09/12/2008 [facility] Patient Information 2015 [place], [place]. This information is not intended to replace advice given to you by your health care provider. Make sure you discuss any questions you have with your health care provider.

## 2014-12-18 ENCOUNTER — Telehealth: Payer: Self-pay | Admitting: Family Medicine

## 2014-12-18 ENCOUNTER — Other Ambulatory Visit: Payer: Self-pay

## 2014-12-18 MED ORDER — DICLOFENAC SODIUM 2 % TD SOLN: TRANSDERMAL | Status: DC

## 2014-12-18 NOTE — Telephone Encounter (Signed)
Patient called regarding the Diclofenac Sodium 2 % SOLN [[id number]. She is self pay and the pharmacy you sent it to is charging her $1800 for this bottle. She stated that the place you sent this prescription to last time it was only $75 Please advise patient

## 2014-12-30 ENCOUNTER — Ambulatory Visit (INDEPENDENT_AMBULATORY_CARE_PROVIDER_SITE_OTHER): Payer: Self-pay | Admitting: Family Medicine

## 2014-12-30 ENCOUNTER — Encounter: Payer: Self-pay | Admitting: Family Medicine

## 2014-12-30 VITALS — BP 116/74 | HR 93 | Temp 98.7°F | Ht 64.0 in | Wt 189.0 lb

## 2014-12-30 DIAGNOSIS — R0683 Snoring: Secondary | ICD-10-CM

## 2014-12-30 DIAGNOSIS — F909 Attention-deficit hyperactivity disorder, unspecified type: Secondary | ICD-10-CM

## 2014-12-30 DIAGNOSIS — G4700 Insomnia, unspecified: Secondary | ICD-10-CM

## 2014-12-30 DIAGNOSIS — F988 Other specified behavioral and emotional disorders with onset usually occurring in childhood and adolescence: Secondary | ICD-10-CM

## 2014-12-30 DIAGNOSIS — E785 Hyperlipidemia, unspecified: Secondary | ICD-10-CM

## 2014-12-30 DIAGNOSIS — F900 Attention-deficit hyperactivity disorder, predominantly inattentive type: Secondary | ICD-10-CM

## 2014-12-30 MED ORDER — METHYLPHENIDATE HCL 20 MG PO TABS: 20.0000 mg | ORAL_TABLET | Freq: Two times a day (BID) | ORAL | Status: DC

## 2014-12-30 MED ORDER — NONFORMULARY OR COMPOUNDED ITEM: Status: AC

## 2014-12-30 MED ORDER — ZOLPIDEM TARTRATE 5 MG PO TABS: 5.0000 mg | ORAL_TABLET | Freq: Every evening | ORAL | Status: DC | PRN

## 2014-12-30 NOTE — Progress Notes (Signed)
Patient ID: Cindy Stephens, female    DOB: 1965/12/05  Age: 49 y.o. MRN: [id number]    Subjective:  Subjective HPI Cindy Stephens presents for f/u add and c/o not being able to sleep while she is away.  They have doing a lot of traveling --- they sleep in a different hotel every night.   No other complaints.    Review of Systems  Constitutional: Negative for diaphoresis, appetite change, fatigue and unexpected weight change.  Eyes: Negative for pain, redness and visual disturbance.  Respiratory: Negative for cough, chest tightness, shortness of breath and wheezing.   Cardiovascular: Negative for chest pain, palpitations and leg swelling.  Endocrine: Negative for cold intolerance, heat intolerance, polydipsia, polyphagia and polyuria.  Genitourinary: Negative for dysuria, frequency and difficulty urinating.  Neurological: Negative for dizziness, light-headedness, numbness and headaches.  Psychiatric/Behavioral: Positive for sleep disturbance. Negative for dysphoric mood and decreased concentration. The patient is not nervous/anxious.     History Past Medical History  Diagnosis Date  . Shoulder dislocation 2007    Left Shoulder  . Depression   . GERD (gastroesophageal reflux disease)   . Allergic rhinitis   . Urinary incontinence     She has past surgical history that includes Elbow surgery (Left, 2007) and Appendectomy.   Her family history includes Alzheimer's disease in her father; Arthritis in her father, mother, sister, and another family member; Diabetes in her paternal grandmother; Heart disease in an other family member; Hyperlipidemia in her father; Hypertension in her father; Osteoarthritis in her brother; Stroke in an other family member.She reports that she has never smoked. She has never used smokeless tobacco. She reports that she drinks alcohol. She reports that she does not use illicit drugs.  Current Outpatient Prescriptions on File Prior to Visit  Medication Sig  Dispense Refill  . aspirin 81 MG tablet Take 81 mg by mouth daily.    Marland Kitchen. buPROPion (WELLBUTRIN XL) 300 MG 24 hr tablet Take 1 tablet (300 mg total) by mouth daily. 90 tablet 3  . co-enzyme Q-10 30 MG capsule Take 30 mg by mouth 3 (three) times daily.    Marland Kitchen. DHEA 10 MG TABS Take by mouth.    . Diclofenac Sodium 2 % SOLN Apply twice daily. 112 g 1  . FLUoxetine (PROZAC) 10 MG capsule Take 1 capsule (10 mg total) by mouth daily. 90 capsule 3  . fluticasone (FLONASE) 50 MCG/ACT nasal spray Place into both nostrils daily.    Marland Kitchen. glucosamine-chondroitin 500-400 MG tablet Take 1 tablet by mouth 2 (two) times daily.    Marland Kitchen. lidocaine (XYLOCAINE) 5 % ointment Apply 1 application topically 3 (three) times daily as needed. Use gloves when applying to left leg. 35.44 g 3  . loratadine (CLARITIN) 10 MG tablet Take 10 mg by mouth daily.    . magnesium oxide (MAG-OX) 400 MG tablet Take 400 mg by mouth daily.    Carlene Coria. [place] Mushroom POWD by Does not apply route.    . Melatonin 10 MG CAPS Take by mouth.    . meloxicam (MOBIC) 15 MG tablet Take 1 tablet (15 mg total) by mouth daily. 30 tablet 0  . Misc Natural Products (PROGESTERONE EX) Apply topically daily.    . montelukast (SINGULAIR) 10 MG tablet Take 10 mg by mouth at bedtime.    Marland Kitchen. NIACIN CR PO Take by mouth.    . Omega-3 Fatty Acids (FISH OIL) 1000 MG CAPS Take 1 capsule by mouth.    Marland Kitchen. omeprazole (  PRILOSEC OTC) 20 MG tablet Take 20 mg by mouth daily.    . Potassium 99 MG TABS Take by mouth.    . Probiotic Product (PROBIOTIC DAILY PO) Take by mouth.    . pseudoephedrine (SUDAFED) 30 MG tablet Take 30 mg by mouth every 4 (four) hours as needed for congestion.    Marland Kitchen TAZORAC 0.05 % cream   5  . Turmeric 500 MG CAPS Take by mouth.    . vitamin B-12 (CYANOCOBALAMIN) 1000 MCG tablet Take 1,000 mcg by mouth daily.     No current facility-administered medications on file prior to visit.     Objective:  Objective Physical Exam  Constitutional: She is oriented to  person, place, and time. She appears well-developed and well-nourished.  HENT:  Head: Normocephalic and atraumatic.  Eyes: Conjunctivae and EOM are normal.  Neck: Normal range of motion. Neck supple. No JVD present. Carotid bruit is not present. No thyromegaly present.  Cardiovascular: Normal rate, regular rhythm and normal heart sounds.   No murmur heard. Pulmonary/Chest: Effort normal and breath sounds normal. No respiratory distress. She has no wheezes. She has no rales. She exhibits no tenderness.  Musculoskeletal: She exhibits no edema.  Neurological: She is alert and oriented to person, place, and time.  Psychiatric: She has a normal mood and affect. Her behavior is normal. Judgment and thought content normal.   BP 116/74 mmHg  Pulse 93  Temp(Src) 98.7 F (37.1 C) (Oral)  Ht 5\' 4"  (1.626 m)  Wt 189 lb (85.73 kg)  BMI 32.43 kg/m2  SpO2 98% Wt Readings from Last 3 Encounters:  12/30/14 189 lb (85.73 kg)  12/17/14 189 lb (85.73 kg)  09/19/14 194 lb (87.998 kg)     Lab Results  Component Value Date   WBC 4.0 11/12/2013   HGB 12.6 11/12/2013   HCT 36.4 11/12/2013   PLT 188.0 11/12/2013   GLUCOSE 107* 03/05/2014   CHOL 190 03/05/2014   TRIG 198.0* 03/05/2014   HDL 34.20* 03/05/2014   LDLCALC 116* 03/05/2014   ALT 19 03/05/2014   AST 21 03/05/2014   NA 134* 03/05/2014   K 4.0 03/05/2014   CL 102 03/05/2014   CREATININE 0.8 03/05/2014   BUN 18 03/05/2014   CO2 27 03/05/2014   TSH 1.45 11/12/2013    No results found.   Assessment & Plan:  Plan I am having Ms. Garramone start on zolpidem and NONFORMULARY OR COMPOUNDED ITEM. I am also having her maintain her loratadine, pseudoephedrine, omeprazole, fluticasone, glucosamine-chondroitin, Fish Oil, Melatonin, DHEA, aspirin, co-enzyme Q-10, vitamin B-12, montelukast, Probiotic Product (PROBIOTIC DAILY PO), Potassium, magnesium oxide, Turmeric, [place] Mushroom, Misc Natural Products (PROGESTERONE EX), TAZORAC, lidocaine,  NIACIN CR PO, buPROPion, FLUoxetine, meloxicam, Diclofenac Sodium, methylphenidate, methylphenidate, and methylphenidate.  Meds ordered this encounter  Medications  . methylphenidate (RITALIN) 20 MG tablet    Sig: Take 1 tablet (20 mg total) by mouth 2 (two) times daily.    Dispense:  60 tablet    Refill:  0  . methylphenidate (RITALIN) 20 MG tablet    Sig: Take 1 tablet (20 mg total) by mouth 2 (two) times daily.    Dispense:  60 tablet    Refill:  0    Do not fill until JULY 2016  . methylphenidate (RITALIN) 20 MG tablet    Sig: Take 1 tablet (20 mg total) by mouth 2 (two) times daily with breakfast and lunch.    Dispense:  60 tablet    Refill:  0    Do not fill until AUGUST 2016  . zolpidem (AMBIEN) 5 MG tablet    Sig: Take 1 tablet (5 mg total) by mouth at bedtime as needed for sleep.    Dispense:  30 tablet    Refill:  0  . NONFORMULARY OR COMPOUNDED ITEM    Sig: Lipid, hep-- hyperlipidemia    Dispense:  1 each    Refill:  0    Problem List Items Addressed This Visit    Snoring   Relevant Orders   Ambulatory referral to Pulmonology   Insomnia    Pt only uses it for traveling.        Relevant Medications   zolpidem (AMBIEN) 5 MG tablet   Other Relevant Orders   Ambulatory referral to Pulmonology   ADD (attention deficit disorder) without hyperactivity    Refill meds--- ritalin F/u 6 months       Other Visit Diagnoses    ADD (attention deficit disorder)    -  Primary    Relevant Medications    methylphenidate (RITALIN) 20 MG tablet    methylphenidate (RITALIN) 20 MG tablet    methylphenidate (RITALIN) 20 MG tablet    Hyperlipidemia        Relevant Medications    NONFORMULARY OR COMPOUNDED ITEM       Follow-up: Return in about 6 months (around 07/02/2015), or if symptoms worsen or fail to improve.  Loreen Freud, DO

## 2014-12-30 NOTE — Patient Instructions (Signed)
Insomnia Insomnia is frequent trouble falling and/or staying asleep. Insomnia can be a long term problem or a short term problem. Both are common. Insomnia can be a short term problem when the wakefulness is related to a certain stress or worry. Long term insomnia is often related to ongoing stress during waking hours and/or poor sleeping habits. Overtime, sleep deprivation itself can make the problem worse. Every little thing feels more severe because you are overtired and your ability to cope is decreased. CAUSES   Stress, anxiety, and depression.  Poor sleeping habits.  Distractions such as TV in the bedroom.  Naps close to bedtime.  Engaging in emotionally charged conversations before bed.  Technical reading before sleep.  Alcohol and other sedatives. They may make the problem worse. They can hurt normal sleep patterns and normal dream activity.  Stimulants such as caffeine for several hours prior to bedtime.  Pain syndromes and shortness of breath can cause insomnia.  Exercise late at night.  Changing time zones may cause sleeping problems (jet lag). It is sometimes helpful to have someone observe your sleeping patterns. They should look for periods of not breathing during the night (sleep apnea). They should also look to see how long those periods last. If you live alone or observers are uncertain, you can also be observed at a sleep clinic where your sleep patterns will be professionally monitored. Sleep apnea requires a checkup and treatment. Give your caregivers your medical history. Give your caregivers observations your family has made about your sleep.  SYMPTOMS   Not feeling rested in the morning.  Anxiety and restlessness at bedtime.  Difficulty falling and staying asleep. TREATMENT   Your caregiver may prescribe treatment for an underlying medical disorders. Your caregiver can give advice or help if you are using alcohol or other drugs for self-medication. Treatment  of underlying problems will usually eliminate insomnia problems.  Medications can be prescribed for short time use. They are generally not recommended for lengthy use.  Over-the-counter sleep medicines are not recommended for lengthy use. They can be habit forming.  You can promote easier sleeping by making lifestyle changes such as:  Using relaxation techniques that help with breathing and reduce muscle tension.  Exercising earlier in the day.  Changing your diet and the time of your last meal. No night time snacks.  Establish a regular time to go to bed.  Counseling can help with stressful problems and worry.  Soothing music and white noise may be helpful if there are background noises you cannot remove.  Stop tedious detailed work at least one hour before bedtime. HOME CARE INSTRUCTIONS   Keep a diary. Inform your caregiver about your progress. This includes any medication side effects. See your caregiver regularly. Take note of:  Times when you are asleep.  Times when you are awake during the night.  The quality of your sleep.  How you feel the next day. This information will help your caregiver care for you.  Get out of bed if you are still awake after 15 minutes. Read or do some quiet activity. Keep the lights down. Wait until you feel sleepy and go back to bed.  Keep regular sleeping and waking hours. Avoid naps.  Exercise regularly.  Avoid distractions at bedtime. Distractions include watching television or engaging in any intense or detailed activity like attempting to balance the household checkbook.  Develop a bedtime ritual. Keep a familiar routine of bathing, brushing your teeth, climbing into bed at the same   time each night, listening to soothing music. Routines increase the success of falling to sleep faster.  Use relaxation techniques. This can be using breathing and muscle tension release routines. It can also include visualizing peaceful scenes. You can  also help control troubling or intruding thoughts by keeping your mind occupied with boring or repetitive thoughts like the old concept of counting sheep. You can make it more creative like imagining planting one beautiful flower after another in your backyard garden.  During your day, work to eliminate stress. When this is not possible use some of the previous suggestions to help reduce the anxiety that accompanies stressful situations. MAKE SURE YOU:   Understand these instructions.  Will watch your condition.  Will get help right away if you are not doing well or get worse. Document Released: 05/28/2000 Document Revised: 08/23/2011 Document Reviewed: 06/28/2007 [employer] Patient Information 2015 [employer]. This information is not intended to replace advice given to you by your health care provider. Make sure you discuss any questions you have with your health care provider.  

## 2014-12-30 NOTE — Assessment & Plan Note (Signed)
Pt only uses it for traveling.

## 2014-12-30 NOTE — Assessment & Plan Note (Addendum)
Refill meds--- ritalin F/u 6 months

## 2014-12-30 NOTE — Progress Notes (Signed)
Pre visit review using our clinic review tool, if applicable. No additional management support is needed unless otherwise documented below in the visit note. 

## 2015-01-01 ENCOUNTER — Ambulatory Visit: Payer: Self-pay | Admitting: Family Medicine

## 2015-01-03 ENCOUNTER — Ambulatory Visit: Payer: Self-pay | Admitting: Family Medicine

## 2015-01-22 ENCOUNTER — Ambulatory Visit: Payer: Self-pay | Admitting: Family Medicine

## 2015-01-23 ENCOUNTER — Other Ambulatory Visit (INDEPENDENT_AMBULATORY_CARE_PROVIDER_SITE_OTHER): Payer: Self-pay

## 2015-01-23 ENCOUNTER — Ambulatory Visit (INDEPENDENT_AMBULATORY_CARE_PROVIDER_SITE_OTHER): Payer: Self-pay | Admitting: Family Medicine

## 2015-01-23 ENCOUNTER — Encounter: Payer: Self-pay | Admitting: Family Medicine

## 2015-01-23 VITALS — BP 124/84 | HR 104 | Wt 187.0 lb

## 2015-01-23 DIAGNOSIS — M7051 Other bursitis of knee, right knee: Secondary | ICD-10-CM

## 2015-01-23 DIAGNOSIS — M25561 Pain in right knee: Secondary | ICD-10-CM

## 2015-01-23 MED ORDER — MELOXICAM 15 MG PO TABS: 15.0000 mg | ORAL_TABLET | Freq: Every day | ORAL | Status: DC

## 2015-01-23 NOTE — Progress Notes (Signed)
[name] [address] [place], [place] [address] Phone: [phone] Subjective:     CC: right knee painfollow-up  [id number] Cindy Stephens is a 49 y.o. female coming in with complaint of right knee pain. Patient was found to have more of a popliteal tendinitis. Patient was given home exercise, compression, icing. In trying to increase her activity. Patient statesshe is feeling much better. Patient actually states that she is no longer having any pain. States that if she starts going down stairs it gives her some mild discomfort. Denies though any numbness. Denies any weakness. Patient states it does feel better when she does take a and time when to wear a regular basis. Sleeping comfortably and doing all activities daily living.     Past Medical History  Diagnosis Date  . Shoulder dislocation 2007    Left Shoulder  . Depression   . GERD (gastroesophageal reflux disease)   . Allergic rhinitis   . Urinary incontinence    Past Surgical History  Procedure Laterality Date  . Elbow surgery Left 2007    elbow was shattered  . Appendectomy     Social History  Substance Use Topics  . Smoking status: Never Smoker   . Smokeless tobacco: Never Used  . Alcohol Use: 0.0 oz/week    0 Standard drinks or equivalent per week     Comment: Occ   Allergies  Allergen Reactions  . Sulfa Antibiotics Hives and Nausea Only     Past medical history, social, surgical and family history all reviewed in electronic medical record.   Review of Systems: No headache, visual changes, nausea, vomiting, diarrhea, constipation, dizziness, abdominal pain, skin rash, fevers, chills, night sweats, weight loss, swollen lymph nodes, body aches, joint swelling, muscle aches, chest pain, shortness of breath, mood changes.   Objective Blood pressure 124/84, pulse 104, weight 187 lb (84.823 kg), SpO2 99 %.  General: No apparent distress alert and oriented x3 mood and affect  normal, dressed appropriately.  HEENT: Pupils equal, extraocular movements intact  Respiratory: Patient's speak in full sentences and does not appear short of breath  Cardiovascular: No lower extremity edema, non tender, no erythema  Skin: Warm dry intact with no signs of infection or rash on extremities or on axial skeleton.  Abdomen: Soft nontender  Neuro: Cranial nerves II through XII are intact, neurovascularly intact in all extremities with 2+ DTRs and 2+ pulses.  Lymph: No lymphadenopathy of posterior or anterior cervical chain or axillae bilaterally.  Gait normal with good balance and coordination.  MSK:  Non tender with full range of motion and good stability and symmetric strength and tone of shoulders, elbows, wrist, hip, and ankles bilaterally.  Knee: Right Normal to inspection with no erythema or effusion or obvious bony abnormalities. Tender on exam today ROM full in flexion and extension and lower leg rotation. Ligaments with solid consistent endpoints including ACL, PCL, LCL, MCL. Negative Mcmurray's, Apley's, and Thessalonian tests. Patellar glide with crepitus. Patellar and quadriceps tendons unremarkable. Hamstring and quadriceps strength is normal.   MSK US performed of: Right knee This study was ordered, performed, and interpreted by Terrilee Files D.O.  Knee: All structures visualized. Anteromedial, anterolateral, posteromedial, and posterolateral menisci unremarkable without tearing, fraying, effusion, or displacement. Mild narrowing of the superior lateral joint space. No hypoechoic changes surrounding the popliteal tendon but still very mild chronic subluxation noted. Patellar Tendon unremarkable on long and transverse views without effusion. No abnormality of prepatellar bursa. LCL  and MCL unremarkable on long and transverse views. No abnormality of origin of medial or lateral head of the gastrocnemius.  IMPRESSION: improvement of the popliteal subluxation      Impression and Recommendations:     This case required medical decision making of moderate complexity.

## 2015-01-23 NOTE — Assessment & Plan Note (Signed)
Improved at this time. Does have some chronic subluxation. If any reaccumulation of pain or any exacerbation patient will come back and we can consider an injection. Patient otherwise will follow-up on an as-needed basis.

## 2015-01-23 NOTE — Progress Notes (Signed)
Pre visit review using our clinic review tool, if applicable. No additional management support is needed unless otherwise documented below in the visit note. 

## 2015-01-23 NOTE — Patient Instructions (Signed)
Nice to see you Glad you're feeling better Continue with the vitamins Try to keep doing the rehab exercises and get active  Add in some extra lower body strengthening at the gym, also biking or elliptical over walking when you can to limit full knee extension  Continue with the BodyHelix sleeve Continue icing after activity on your knees  See me again when you need me.

## 2015-02-18 ENCOUNTER — Institutional Professional Consult (permissible substitution): Payer: Self-pay | Admitting: Pulmonary Disease

## 2015-04-16 ENCOUNTER — Institutional Professional Consult (permissible substitution): Payer: Self-pay | Admitting: Pulmonary Disease

## 2015-07-08 ENCOUNTER — Telehealth: Payer: Self-pay | Admitting: Family Medicine

## 2015-07-16 NOTE — Telephone Encounter (Signed)
.  mychart

## 2015-07-21 ENCOUNTER — Telehealth: Payer: Self-pay | Admitting: Family Medicine

## 2015-07-21 NOTE — Telephone Encounter (Signed)
Pt is calling for refill on methylphenidate. Takes it prn. Not every day. Had it filled twice. The 3rd RX expired. She is taking the wellbutrin every day. She is still uninsured and not wanting to schedule appt unless completely necessarily. Please call when RX ready to pick up [phone]. She would rather wait til spring for visit. Pt notes she has lost some weight. She is about 180 lbs now.

## 2015-07-21 NOTE — Telephone Encounter (Signed)
Patient's normally seen q6 mos, she is overdue. Please advise    KP

## 2015-07-21 NOTE — Telephone Encounter (Signed)
She needs an apt.     KP 

## 2015-07-21 NOTE — Telephone Encounter (Signed)
Need to be seen every 6 months for med

## 2015-07-22 NOTE — Telephone Encounter (Signed)
LM for pt to call and schedule f/u appt per Dr. Laury Axon to cont getting meds

## 2015-08-05 ENCOUNTER — Encounter: Payer: Self-pay | Admitting: Family Medicine

## 2015-08-05 ENCOUNTER — Ambulatory Visit (INDEPENDENT_AMBULATORY_CARE_PROVIDER_SITE_OTHER): Payer: Self-pay | Admitting: Family Medicine

## 2015-08-05 VITALS — BP 120/68 | HR 97 | Temp 98.5°F | Wt 186.0 lb

## 2015-08-05 DIAGNOSIS — F988 Other specified behavioral and emotional disorders with onset usually occurring in childhood and adolescence: Secondary | ICD-10-CM

## 2015-08-05 DIAGNOSIS — F909 Attention-deficit hyperactivity disorder, unspecified type: Secondary | ICD-10-CM

## 2015-08-05 DIAGNOSIS — F329 Major depressive disorder, single episode, unspecified: Secondary | ICD-10-CM

## 2015-08-05 DIAGNOSIS — F32A Depression, unspecified: Secondary | ICD-10-CM

## 2015-08-05 MED ORDER — BUPROPION HCL ER (XL) 300 MG PO TB24: 300.0000 mg | ORAL_TABLET | Freq: Every day | ORAL | Status: DC

## 2015-08-05 MED ORDER — METHYLPHENIDATE HCL 20 MG PO TABS: 20.0000 mg | ORAL_TABLET | Freq: Two times a day (BID) | ORAL | Status: DC

## 2015-08-05 NOTE — Progress Notes (Signed)
Pre visit review using our clinic review tool, if applicable. No additional management support is needed unless otherwise documented below in the visit note. 

## 2015-08-05 NOTE — Progress Notes (Signed)
Patient ID: Cindy Stephens, female    DOB: 08-28-1965  Age: 50 y.o. MRN: [id number]    Subjective:  Subjective HPI Cindy Stephens presents for f/u add and depression.  Pt is doing well . No complaints.  Review of Systems  Constitutional: Negative for diaphoresis, appetite change, fatigue and unexpected weight change.  Eyes: Negative for pain, redness and visual disturbance.  Respiratory: Negative for cough, chest tightness, shortness of breath and wheezing.   Cardiovascular: Negative for chest pain, palpitations and leg swelling.  Endocrine: Negative for cold intolerance, heat intolerance, polydipsia, polyphagia and polyuria.  Genitourinary: Negative for dysuria, frequency and difficulty urinating.  Neurological: Negative for dizziness, light-headedness, numbness and headaches.    History Past Medical History  Diagnosis Date  . Shoulder dislocation 2007    Left Shoulder  . Depression   . GERD (gastroesophageal reflux disease)   . Allergic rhinitis   . Urinary incontinence     She has past surgical history that includes Elbow surgery (Left, 2007) and Appendectomy.   Her family history includes Alzheimer's disease in her father; Arthritis in her father, mother, and sister; Diabetes in her paternal grandmother; Hyperlipidemia in her father; Hypertension in her father; Osteoarthritis in her brother.She reports that she has never smoked. She has never used smokeless tobacco. She reports that she drinks alcohol. She reports that she does not use illicit drugs.  Current Outpatient Prescriptions on File Prior to Visit  Medication Sig Dispense Refill  . aspirin 81 MG tablet Take 81 mg by mouth daily.    Marland Kitchen co-enzyme Q-10 30 MG capsule Take 30 mg by mouth 3 (three) times daily.    Marland Kitchen DHEA 10 MG TABS Take by mouth.    . Diclofenac Sodium 2 % SOLN Apply twice daily. 112 g 1  . FLUoxetine (PROZAC) 10 MG capsule Take 1 capsule (10 mg total) by mouth daily. 90 capsule 3  . fluticasone (FLONASE)  50 MCG/ACT nasal spray Place into both nostrils daily.    Marland Kitchen glucosamine-chondroitin 500-400 MG tablet Take 1 tablet by mouth 2 (two) times daily.    Marland Kitchen lidocaine (XYLOCAINE) 5 % ointment Apply 1 application topically 3 (three) times daily as needed. Use gloves when applying to left leg. 35.44 g 3  . loratadine (CLARITIN) 10 MG tablet Take 10 mg by mouth daily.    . magnesium oxide (MAG-OX) 400 MG tablet Take 400 mg by mouth daily.    Carlene Coria Mushroom POWD by Does not apply route.    . Melatonin 10 MG CAPS Take by mouth.    . meloxicam (MOBIC) 15 MG tablet Take 1 tablet (15 mg total) by mouth daily. 90 tablet 2  . Misc Natural Products (PROGESTERONE EX) Apply topically daily.    . montelukast (SINGULAIR) 10 MG tablet Take 10 mg by mouth at bedtime.    Marland Kitchen NIACIN CR PO Take by mouth.    . NONFORMULARY OR COMPOUNDED ITEM Lipid, hep-- hyperlipidemia 1 each 0  . Omega-3 Fatty Acids (FISH OIL) 1000 MG CAPS Take 1 capsule by mouth.    Marland Kitchen omeprazole (PRILOSEC OTC) 20 MG tablet Take 20 mg by mouth daily.    . Potassium 99 MG TABS Take by mouth.    . Probiotic Product (PROBIOTIC DAILY PO) Take by mouth.    . pseudoephedrine (SUDAFED) 30 MG tablet Take 30 mg by mouth every 4 (four) hours as needed for congestion.    Marland Kitchen TAZORAC 0.05 % cream   5  . Turmeric  500 MG CAPS Take by mouth.    . vitamin B-12 (CYANOCOBALAMIN) 1000 MCG tablet Take 1,000 mcg by mouth daily.    Marland Kitchen zolpidem (AMBIEN) 5 MG tablet Take 1 tablet (5 mg total) by mouth at bedtime as needed for sleep. 30 tablet 0   No current facility-administered medications on file prior to visit.     Objective:  Objective Physical Exam  Constitutional: She is oriented to person, place, and time. She appears well-developed and well-nourished.  HENT:  Head: Normocephalic and atraumatic.  Eyes: Conjunctivae and EOM are normal.  Neck: Normal range of motion. Neck supple. No JVD present. Carotid bruit is not present. No thyromegaly present.    Cardiovascular: Normal rate, regular rhythm and normal heart sounds.   No murmur heard. Pulmonary/Chest: Effort normal and breath sounds normal. No respiratory distress. She has no wheezes. She has no rales. She exhibits no tenderness.  Musculoskeletal: She exhibits no edema.  Neurological: She is alert and oriented to person, place, and time.  Psychiatric: She has a normal mood and affect.  Nursing note and vitals reviewed.  BP 120/68 mmHg  Pulse 97  Temp(Src) 98.5 F (36.9 C) (Oral)  Wt 186 lb (84.369 kg)  SpO2 98%  LMP 07/08/2015 Wt Readings from Last 3 Encounters:  08/05/15 186 lb (84.369 kg)  01/23/15 187 lb (84.823 kg)  12/30/14 189 lb (85.73 kg)     Lab Results  Component Value Date   WBC 4.0 11/12/2013   HGB 12.6 11/12/2013   HCT 36.4 11/12/2013   PLT 188.0 11/12/2013   GLUCOSE 107* 03/05/2014   CHOL 190 03/05/2014   TRIG 198.0* 03/05/2014   HDL 34.20* 03/05/2014   LDLCALC 116* 03/05/2014   ALT 19 03/05/2014   AST 21 03/05/2014   NA 134* 03/05/2014   K 4.0 03/05/2014   CL 102 03/05/2014   CREATININE 0.8 03/05/2014   BUN 18 03/05/2014   CO2 27 03/05/2014   TSH 1.45 11/12/2013    No results found.   Assessment & Plan:  Plan I am having Ms. Mcclary maintain her loratadine, pseudoephedrine, omeprazole, fluticasone, glucosamine-chondroitin, Fish Oil, Melatonin, DHEA, aspirin, co-enzyme Q-10, vitamin B-12, montelukast, Probiotic Product (PROBIOTIC DAILY PO), Potassium, magnesium oxide, Turmeric, [place] Mushroom, Misc Natural Products (PROGESTERONE EX), TAZORAC, lidocaine, NIACIN CR PO, FLUoxetine, Diclofenac Sodium, zolpidem, NONFORMULARY OR COMPOUNDED ITEM, meloxicam, methylphenidate, methylphenidate, methylphenidate, and buPROPion.  Meds ordered this encounter  Medications  . methylphenidate (RITALIN) 20 MG tablet    Sig: Take 1 tablet (20 mg total) by mouth 2 (two) times daily with breakfast and lunch.    Dispense:  60 tablet    Refill:  0    Do not  fill until APRIL 2017  . methylphenidate (RITALIN) 20 MG tablet    Sig: Take 1 tablet (20 mg total) by mouth 2 (two) times daily.    Dispense:  60 tablet    Refill:  0    Do not fill until March 2017  . methylphenidate (RITALIN) 20 MG tablet    Sig: Take 1 tablet (20 mg total) by mouth 2 (two) times daily.    Dispense:  60 tablet    Refill:  0  . buPROPion (WELLBUTRIN XL) 300 MG 24 hr tablet    Sig: Take 1 tablet (300 mg total) by mouth daily.    Dispense:  90 tablet    Refill:  3    Problem List Items Addressed This Visit      Unprioritized   Depression  Relevant Medications   buPROPion (WELLBUTRIN XL) 300 MG 24 hr tablet    Other Visit Diagnoses    ADD (attention deficit disorder)    -  Primary    Relevant Medications    methylphenidate (RITALIN) 20 MG tablet    methylphenidate (RITALIN) 20 MG tablet    methylphenidate (RITALIN) 20 MG tablet       Follow-up: Return in about 6 months (around 02/02/2016), or if symptoms worsen or fail to improve.  Loreen Freud, DO

## 2015-08-05 NOTE — Patient Instructions (Signed)
Attention Deficit Hyperactivity Disorder  Attention deficit hyperactivity disorder (ADHD) is a problem with behavior issues based on the way the brain functions (neurobehavioral disorder). It is a common reason for behavior and academic problems in school.  SYMPTOMS   There are 3 types of ADHD. The 3 types and some of the symptoms include:  · Inattentive.    Gets bored or distracted easily.    Loses or forgets things. Forgets to hand in homework.    Has trouble organizing or completing tasks.    Difficulty staying on task.    An inability to organize daily tasks and school work.    Leaving projects, chores, or homework unfinished.    Trouble paying attention or responding to details. Careless mistakes.    Difficulty following directions. Often seems like is not listening.    Dislikes activities that require sustained attention (like chores or homework).  · Hyperactive-impulsive.    Feels like it is impossible to sit still or stay in a seat. Fidgeting with hands and feet.    Trouble waiting turn.    Talking too much or out of turn. Interruptive.    Speaks or acts impulsively.    Aggressive, disruptive behavior.    Constantly busy or on the go; noisy.    Often leaves seat when they are expected to remain seated.    Often runs or climbs where it is not appropriate, or feels very restless.  · Combined.    Has symptoms of both of the above.  Often children with ADHD feel discouraged about themselves and with school. They often perform well below their abilities in school.  As children get older, the excess motor activities can calm down, but the problems with paying attention and staying organized persist. Most children do not outgrow ADHD but with good treatment can learn to cope with the symptoms.  DIAGNOSIS   When ADHD is suspected, the diagnosis should be made by professionals trained in ADHD. This professional will collect information about the individual suspected of having ADHD. Information must be collected from  various settings where the person lives, works, or attends school.    Diagnosis will include:  · Confirming symptoms began in childhood.  · Ruling out other reasons for the child's behavior.  · The health care providers will check with the child's school and check their medical records.  · They will talk to teachers and parents.  · Behavior rating scales for the child will be filled out by those dealing with the child on a daily basis.  A diagnosis is made only after all information has been considered.  TREATMENT   Treatment usually includes behavioral treatment, tutoring or extra support in school, and stimulant medicines. Because of the way a person's brain works with ADHD, these medicines decrease impulsivity and hyperactivity and increase attention. This is different than how they would work in a person who does not have ADHD. Other medicines used include antidepressants and certain blood pressure medicines.  Most experts agree that treatment for ADHD should address all aspects of the person's functioning. Along with medicines, treatment should include structured classroom management at school. Parents should reward good behavior, provide constant discipline, and set limits. Tutoring should be available for the child as needed.  ADHD is a lifelong condition. If untreated, the disorder can have long-term serious effects into adolescence and adulthood.  HOME CARE INSTRUCTIONS   · Often with ADHD there is a lot of frustration among family members dealing with the condition. Blame   and anger are also feelings that are common. In many cases, because the problem affects the family as a whole, the entire family may need help. A therapist can help the family find better ways to handle the disruptive behaviors of the person with ADHD and promote change. If the person with ADHD is young, most of the therapist's work is with the parents. Parents will learn techniques for coping with and improving their child's behavior.  Sometimes only the child with the ADHD needs counseling. Your health care providers can help you make these decisions.  · Children with ADHD may need help learning how to organize. Some helpful tips include:  ¨ Keep routines the same every day from wake-up time to bedtime. Schedule all activities, including homework and playtime. Keep the schedule in a place where the person with ADHD will often see it. Mark schedule changes as far in advance as possible.  ¨ Schedule outdoor and indoor recreation.  ¨ Have a place for everything and keep everything in its place. This includes clothing, backpacks, and school supplies.  ¨ Encourage writing down assignments and bringing home needed books. Work with your child's teachers for assistance in organizing school work.  · Offer your child a well-balanced diet. Breakfast that includes a balance of whole grains, protein, and fruits or vegetables is especially important for school performance. Children should avoid drinks with caffeine including:  ¨ Soft drinks.  ¨ Coffee.  ¨ Tea.  ¨ However, some older children (adolescents) may find these drinks helpful in improving their attention. Because it can also be common for adolescents with ADHD to become addicted to caffeine, talk with your health care provider about what is a safe amount of caffeine intake for your child.  · Children with ADHD need consistent rules that they can understand and follow. If rules are followed, give small rewards. Children with ADHD often receive, and expect, criticism. Look for good behavior and praise it. Set realistic goals. Give clear instructions. Look for activities that can foster success and self-esteem. Make time for pleasant activities with your child. Give lots of affection.  · Parents are their children's greatest advocates. Learn as much as possible about ADHD. This helps you become a stronger and better advocate for your child. It also helps you educate your child's teachers and instructors  if they feel inadequate in these areas. Parent support groups are often helpful. A national group with local chapters is called Children and Adults with Attention Deficit Hyperactivity Disorder (CHADD).  SEEK MEDICAL CARE IF:  · Your child has repeated muscle twitches, cough, or speech outbursts.  · Your child has sleep problems.  · Your child has a marked loss of appetite.  · Your child develops depression.  · Your child has new or worsening behavioral problems.  · Your child develops dizziness.  · Your child has a racing heart.  · Your child has stomach pains.  · Your child develops headaches.  SEEK IMMEDIATE MEDICAL CARE IF:  · Your child has been diagnosed with depression or anxiety and the symptoms seem to be getting worse.  · Your child has been depressed and suddenly appears to have increased energy or motivation.  · You are worried that your child is having a bad reaction to a medication he or she is taking for ADHD.     This information is not intended to replace advice given to you by your health care provider. Make sure you discuss any questions you have with your   health care provider.     Document Released: 05/21/2002 Document Revised: 06/05/2013 Document Reviewed: 02/05/2013  Elsevier Interactive Patient Education ©2016 [employer]

## 2015-10-06 ENCOUNTER — Telehealth: Payer: Self-pay | Admitting: *Deleted

## 2015-10-06 MED ORDER — MELOXICAM 15 MG PO TABS: 15.0000 mg | ORAL_TABLET | Freq: Every day | ORAL | Status: DC

## 2015-10-06 NOTE — Telephone Encounter (Signed)
Pt left msg on triage requesting refill on meloxicam.../lmb

## 2015-10-06 NOTE — Telephone Encounter (Signed)
Refill done. Pt will have to be seen for next refill.

## 2015-11-05 ENCOUNTER — Other Ambulatory Visit: Payer: Self-pay | Admitting: Family Medicine

## 2015-11-05 DIAGNOSIS — F988 Other specified behavioral and emotional disorders with onset usually occurring in childhood and adolescence: Secondary | ICD-10-CM

## 2015-11-05 NOTE — Telephone Encounter (Signed)
Pt is requesting a medication refill on methylphenidate .Marland Kitchen. Quantity 60   CB: [phone]

## 2015-11-05 NOTE — Telephone Encounter (Signed)
Last OV: 08/05/15 Last filled: 08/05/15, 3 rxs written for #60, 0 RF Sig: Take 1 tablet (20 mg total) by mouth 2 (two) times daily with breakfast and lunch. UDS: Not on file

## 2015-11-06 MED ORDER — METHYLPHENIDATE HCL 20 MG PO TABS: 20.0000 mg | ORAL_TABLET | Freq: Two times a day (BID) | ORAL | Status: DC

## 2015-11-06 NOTE — Telephone Encounter (Signed)
VM left advising Rx ready for pick up.     KP 

## 2015-11-06 NOTE — Telephone Encounter (Signed)
Need contract and uds On to refill for 3 months

## 2015-12-09 ENCOUNTER — Telehealth: Payer: Self-pay | Admitting: *Deleted

## 2015-12-09 ENCOUNTER — Other Ambulatory Visit: Payer: Self-pay

## 2015-12-09 DIAGNOSIS — M25561 Pain in right knee: Secondary | ICD-10-CM

## 2015-12-09 MED ORDER — MELOXICAM 15 MG PO TABS: 15.0000 mg | ORAL_TABLET | Freq: Every day | ORAL | Status: DC

## 2015-12-09 NOTE — Telephone Encounter (Signed)
Refill sent to pharmacy. Must be seen for next refill

## 2015-12-09 NOTE — Telephone Encounter (Signed)
Left msg on triage requesting refill on meloxicam.../lmb

## 2015-12-11 ENCOUNTER — Telehealth: Payer: Self-pay | Admitting: Family Medicine

## 2015-12-11 NOTE — Telephone Encounter (Signed)
I prefer not to give 2 meds ---- valium can used for both---  5 mg tid prn #40

## 2015-12-11 NOTE — Telephone Encounter (Signed)
Pt takes long trips in summertime averaging 3-3.5 weeks and stays different hotel almost every night. She is leaving on a trip on 12/26/15. "I have a terrible time sleeping" on these trips. Last summer Dr. Laury AxonLowne prescribed a small rx for Ambien to use while traveling, and pt would like a refill if appropriate. She takes this medication only every 2-3 nights and only while she is traveling. She usually takes a half pill, and then takes the other half if she still cannot sleep. She still has some leftover pills from her last rx in 12/2014.  She also reports her family wants to drive [place]," which is 30 miles/45-60 minutes in and out of [place] w/ lots of hair pin turns, 1 lane tunnels, narrow lanes, and steep drop offs. She remembers her parents taking her as a child and it was "terrifying." She is notably nervous/anxious over the phone even talking about this experience. She reports fear of heights and claustrophobia. She wants to be able to join her family on this drive, because otherwise they will have to loop back and go 2-3 hours out of their way to come back for her, but she is not sure if she can do it. She would like a very small rx for something to help her get through this experience. She specifically mentioned valium, which she has never taken before, but is open to whatever medication PCP think is most appropriate for her needs.   AMBIEN Last OV: 08/05/15 Last filled: 12/30/14, #30, 0 RF  Sig: Take 1 tablet (5 mg total) by mouth at bedtime as needed for sleep. UDS: Not on file

## 2015-12-11 NOTE — Telephone Encounter (Signed)
Relation to EA:VWUJpt:self Call back [phone] Pharmacy: [facility] [address] - [place], [place] - [id number] [phone] (Phone) [phone] (Fax)         Reason for call:  Patient would like to discuss her anxiety leading up to her trip. Patient requesting clinical advice and perhaps a Rx. Please advise

## 2015-12-12 MED ORDER — DIAZEPAM 5 MG PO TABS: 5.0000 mg | ORAL_TABLET | Freq: Three times a day (TID) | ORAL | Status: DC | PRN

## 2015-12-12 NOTE — Telephone Encounter (Signed)
Rx printed, to PCP for signature. 

## 2015-12-12 NOTE — Telephone Encounter (Signed)
Rx faxed to pharmacy, pt notified. She is concerned about taking the valium for sleep. She does not know if it will work as well as Facilities managerambien. She will pick up rx and try it before she leaves for trip and return call if any concerns.

## 2015-12-15 ENCOUNTER — Telehealth: Payer: Self-pay | Admitting: Family Medicine

## 2015-12-15 NOTE — Telephone Encounter (Signed)
Patient states that she was told that the fee for her UDS to receive her Rx for Ritalin would be waived but she received a bill. Patient is very upset that she received a bill. States she would not have done the test if she knew she had to pay for it because she is a self-pay patient without insurance.

## 2015-12-15 NOTE — Telephone Encounter (Signed)
Called pt and left a message for call back.

## 2015-12-15 NOTE — Telephone Encounter (Signed)
Patient called stating that she was told if she had the UDS done to get her Rx for Ritalin

## 2015-12-17 NOTE — Telephone Encounter (Signed)
I gave the patient the [phone] number to assured to call about the bill.    KP

## 2015-12-23 ENCOUNTER — Encounter: Payer: Self-pay | Admitting: Family Medicine

## 2016-01-31 ENCOUNTER — Other Ambulatory Visit: Payer: Self-pay | Admitting: Family Medicine

## 2016-02-02 NOTE — Telephone Encounter (Signed)
Refill denied. Pt has not been seen within a year. Refill denied.

## 2016-04-26 ENCOUNTER — Other Ambulatory Visit: Payer: Self-pay | Admitting: Family Medicine

## 2016-04-27 NOTE — Telephone Encounter (Signed)
Refill denied. Pt hasn't been seen in over a year.  

## 2016-05-28 ENCOUNTER — Telehealth: Payer: Self-pay | Admitting: Family Medicine

## 2016-05-28 NOTE — Telephone Encounter (Signed)
°  Relationship to patient: Self Can be reached: [phone]    Reason for call: Request refill on methylphenidate (RITALIN) 20 MG tablet [[id number]][[id number]]

## 2016-05-31 NOTE — Telephone Encounter (Signed)
Last refill 12/2015 Last office visit 08/05/2015--no upcoming appt. Scheduled at this time Contract===11/14/2015 UDS------low risk -- due 05/15/2016

## 2016-05-31 NOTE — Telephone Encounter (Signed)
Left detailed message on cell# re: below instruction and to call and schedule office visit for further refills.

## 2016-05-31 NOTE — Telephone Encounter (Signed)
Overdue for follow up.  Needs OV prior to additional refills.

## 2016-06-15 ENCOUNTER — Encounter: Payer: Self-pay | Admitting: Family Medicine

## 2016-06-15 ENCOUNTER — Ambulatory Visit: Payer: Self-pay | Admitting: Family Medicine

## 2016-06-15 ENCOUNTER — Ambulatory Visit (INDEPENDENT_AMBULATORY_CARE_PROVIDER_SITE_OTHER): Payer: Self-pay | Admitting: Family Medicine

## 2016-06-15 VITALS — BP 128/85 | HR 94 | Temp 98.6°F | Resp 16 | Ht 64.0 in | Wt 188.4 lb

## 2016-06-15 DIAGNOSIS — F988 Other specified behavioral and emotional disorders with onset usually occurring in childhood and adolescence: Secondary | ICD-10-CM | POA: Insufficient documentation

## 2016-06-15 DIAGNOSIS — G4700 Insomnia, unspecified: Secondary | ICD-10-CM

## 2016-06-15 DIAGNOSIS — F321 Major depressive disorder, single episode, moderate: Secondary | ICD-10-CM | POA: Insufficient documentation

## 2016-06-15 DIAGNOSIS — Z789 Other specified health status: Secondary | ICD-10-CM | POA: Insufficient documentation

## 2016-06-15 DIAGNOSIS — J45909 Unspecified asthma, uncomplicated: Secondary | ICD-10-CM | POA: Insufficient documentation

## 2016-06-15 DIAGNOSIS — K1379 Other lesions of oral mucosa: Secondary | ICD-10-CM | POA: Insufficient documentation

## 2016-06-15 MED ORDER — BUPROPION HCL ER (XL) 300 MG PO TB24: 300.0000 mg | ORAL_TABLET | Freq: Every day | ORAL | 3 refills | Status: DC

## 2016-06-15 MED ORDER — MONTELUKAST SODIUM 10 MG PO TABS: 10.0000 mg | ORAL_TABLET | Freq: Every day | ORAL | 3 refills | Status: DC

## 2016-06-15 MED ORDER — METHYLPHENIDATE HCL 20 MG PO TABS: 20.0000 mg | ORAL_TABLET | Freq: Two times a day (BID) | ORAL | 0 refills | Status: DC

## 2016-06-15 MED ORDER — MELOXICAM 15 MG PO TABS: 15.0000 mg | ORAL_TABLET | Freq: Every day | ORAL | 2 refills | Status: DC

## 2016-06-15 MED ORDER — MELOXICAM 15 MG PO TABS: 15.0000 mg | ORAL_TABLET | Freq: Every day | ORAL | 3 refills | Status: DC

## 2016-06-15 MED ORDER — ZOLPIDEM TARTRATE 5 MG PO TABS: 5.0000 mg | ORAL_TABLET | Freq: Every evening | ORAL | 0 refills | Status: DC | PRN

## 2016-06-15 NOTE — Assessment & Plan Note (Signed)
Refill singular  

## 2016-06-15 NOTE — Assessment & Plan Note (Signed)
Stable.  Refills done.

## 2016-06-15 NOTE — Progress Notes (Signed)
Pre visit review using our clinic review tool, if applicable. No additional management support is needed unless otherwise documented below in the visit note. 

## 2016-06-15 NOTE — Assessment & Plan Note (Signed)
Refill meds stable 

## 2016-06-15 NOTE — Progress Notes (Signed)
Patient ID: Cindy Stephens, female    DOB: 23-Sep-1965  Age: 51 y.o. MRN: [id number]    Subjective:  Subjective  HPI [name] presents for f/u add.  She has no new complaints.    Review of Systems  Constitutional: Negative for activity change, appetite change, fatigue and unexpected weight change.  Respiratory: Negative for cough and shortness of breath.   Cardiovascular: Negative for chest pain and palpitations.  Psychiatric/Behavioral: Negative for behavioral problems and dysphoric mood. The patient is not nervous/anxious.     History Past Medical History:  Diagnosis Date  . Allergic rhinitis   . Depression   . GERD (gastroesophageal reflux disease)   . Shoulder dislocation 2007   Left Shoulder  . Urinary incontinence     She has a past surgical history that includes Elbow surgery (Left, 2007) and Appendectomy.   Her family history includes Alzheimer's disease in her father; Arthritis in her father, mother, and sister; Diabetes in her paternal grandmother; Hyperlipidemia in her father; Hypertension in her father; Osteoarthritis in her brother.She reports that she has never smoked. She has never used smokeless tobacco. She reports that she drinks alcohol. She reports that she does not use drugs.  Current Outpatient Prescriptions on File Prior to Visit  Medication Sig Dispense Refill  . aspirin 81 MG tablet Take 81 mg by mouth daily.    Marland Kitchen co-enzyme Q-10 30 MG capsule Take 30 mg by mouth 3 (three) times daily.    Marland Kitchen DHEA 10 MG TABS Take by mouth.    . diazepam (VALIUM) 5 MG tablet Take 1 tablet (5 mg total) by mouth 3 (three) times daily as needed for anxiety. 40 tablet 0  . Diclofenac Sodium 2 % SOLN Apply twice daily. 112 g 1  . FLUoxetine (PROZAC) 10 MG capsule Take 1 capsule (10 mg total) by mouth daily. 90 capsule 3  . fluticasone (FLONASE) 50 MCG/ACT nasal spray Place into both nostrils daily.    Marland Kitchen glucosamine-chondroitin 500-400 MG tablet Take 1 tablet by mouth 2 (two)  times daily.    Marland Kitchen lidocaine (XYLOCAINE) 5 % ointment Apply 1 application topically 3 (three) times daily as needed. Use gloves when applying to left leg. 35.44 g 3  . loratadine (CLARITIN) 10 MG tablet Take 10 mg by mouth daily.    . magnesium oxide (MAG-OX) 400 MG tablet Take 400 mg by mouth daily.    Carlene Coria Mushroom POWD by Does not apply route.    . Misc Natural Products (PROGESTERONE EX) Apply topically daily.    Marland Kitchen NIACIN CR PO Take by mouth.    . NONFORMULARY OR COMPOUNDED ITEM Lipid, hep-- hyperlipidemia 1 each 0  . Omega-3 Fatty Acids (FISH OIL) 1000 MG CAPS Take 1 capsule by mouth.    Marland Kitchen omeprazole (PRILOSEC OTC) 20 MG tablet Take 20 mg by mouth daily.    . Probiotic Product (PROBIOTIC DAILY PO) Take by mouth.    . pseudoephedrine (SUDAFED) 30 MG tablet Take 30 mg by mouth every 4 (four) hours as needed for congestion.    Marland Kitchen TAZORAC 0.05 % cream   5  . Turmeric 500 MG CAPS Take by mouth.    . Melatonin 10 MG CAPS Take by mouth.    . Potassium 99 MG TABS Take by mouth.    . vitamin B-12 (CYANOCOBALAMIN) 1000 MCG tablet Take 1,000 mcg by mouth daily.     No current facility-administered medications on file prior to visit.  Objective:  Objective  Physical Exam  Constitutional: She is oriented to person, place, and time. She appears well-developed and well-nourished.  HENT:  Head: Normocephalic and atraumatic.  Eyes: Conjunctivae and EOM are normal.  Neck: Normal range of motion. Neck supple. No JVD present. Carotid bruit is not present. No thyromegaly present.  Cardiovascular: Normal rate, regular rhythm and normal heart sounds.   No murmur heard. Pulmonary/Chest: Effort normal and breath sounds normal. No respiratory distress. She has no wheezes. She has no rales. She exhibits no tenderness.  Musculoskeletal: She exhibits no edema.  Neurological: She is alert and oriented to person, place, and time.  Psychiatric: She has a normal mood and affect. Her behavior is normal.  Judgment and thought content normal.  Nursing note and vitals reviewed.  BP 128/85 (BP Location: Right Arm, Patient Position: Sitting, Cuff Size: Large)   Pulse 94   Temp 98.6 F (37 C) (Oral)   Resp 16   Ht 5\' 4"  (1.626 m)   Wt 188 lb 6.4 oz (85.5 kg)   LMP 06/13/2016   SpO2 96%   BMI 32.34 kg/m  Wt Readings from Last 3 Encounters:  06/15/16 188 lb 6.4 oz (85.5 kg)  08/05/15 186 lb (84.4 kg)  01/23/15 187 lb (84.8 kg)     Lab Results  Component Value Date   WBC 4.0 11/12/2013   HGB 12.6 11/12/2013   HCT 36.4 11/12/2013   PLT 188.0 11/12/2013   GLUCOSE 107 (H) 03/05/2014   CHOL 190 03/05/2014   TRIG 198.0 (H) 03/05/2014   HDL 34.20 (L) 03/05/2014   LDLCALC 116 (H) 03/05/2014   ALT 19 03/05/2014   AST 21 03/05/2014   NA 134 (L) 03/05/2014   K 4.0 03/05/2014   CL 102 03/05/2014   CREATININE 0.8 03/05/2014   BUN 18 03/05/2014   CO2 27 03/05/2014   TSH 1.45 11/12/2013    No results found.   Assessment & Plan:  Plan  I have discontinued Ms. Weber's meloxicam. I have also changed her meloxicam and montelukast. Additionally, I am having her maintain her loratadine, pseudoephedrine, omeprazole, fluticasone, glucosamine-chondroitin, Fish Oil, Melatonin, DHEA, aspirin, co-enzyme Q-10, vitamin B-12, Probiotic Product (PROBIOTIC DAILY PO), Potassium, magnesium oxide, Turmeric, Mait[place] Mushroom, Misc Natural Products (PROGESTERONE EX), TAZORAC, lidocaine, NIACIN CR PO, FLUoxetine, Diclofenac Sodium, NONFORMULARY OR COMPOUNDED ITEM, diazepam, methylphenidate, methylphenidate, methylphenidate, buPROPion, and zolpidem.  Meds ordered this encounter  Medications  . methylphenidate (RITALIN) 20 MG tablet    Sig: Take 1 tablet (20 mg total) by mouth 2 (two) times daily.    Dispense:  60 tablet    Refill:  0  . methylphenidate (RITALIN) 20 MG tablet    Sig: Take 1 tablet (20 mg total) by mouth 2 (two) times daily.    Dispense:  60 tablet    Refill:  0    Do not fill until feb  2018  . methylphenidate (RITALIN) 20 MG tablet    Sig: Take 1 tablet (20 mg total) by mouth 2 (two) times daily with breakfast and lunch.    Dispense:  60 tablet    Refill:  0    Do not fill until march 2018  . buPROPion (WELLBUTRIN XL) 300 MG 24 hr tablet    Sig: Take 1 tablet (300 mg total) by mouth daily.    Dispense:  90 tablet    Refill:  3  . DISCONTD: meloxicam (MOBIC) 15 MG tablet    Sig: Take 1 tablet (15 mg total)  by mouth daily. OFFICE VISIT FOR NEXT REFILL    Dispense:  30 tablet    Refill:  2  . meloxicam (MOBIC) 15 MG tablet    Sig: Take 1 tablet (15 mg total) by mouth daily.    Dispense:  90 tablet    Refill:  3    D/c #30  rx  . montelukast (SINGULAIR) 10 MG tablet    Sig: Take 1 tablet (10 mg total) by mouth at bedtime.    Dispense:  90 tablet    Refill:  3  . zolpidem (AMBIEN) 5 MG tablet    Sig: Take 1 tablet (5 mg total) by mouth at bedtime as needed for sleep.    Dispense:  30 tablet    Refill:  0    Problem List Items Addressed This Visit      Unprioritized   Insomnia   Relevant Medications   zolpidem (AMBIEN) 5 MG tablet   Allergy history unknown    Refill singular       Relevant Medications   montelukast (SINGULAIR) 10 MG tablet   Asthma due to environmental allergies    Stable Refills done      Relevant Medications   montelukast (SINGULAIR) 10 MG tablet   Attention deficit disorder    Refill meds stable      Relevant Medications   methylphenidate (RITALIN) 20 MG tablet   methylphenidate (RITALIN) 20 MG tablet   methylphenidate (RITALIN) 20 MG tablet   Moderate single current episode of major depressive disorder (HCC)    con't wellbutrin      Relevant Medications   buPROPion (WELLBUTRIN XL) 300 MG 24 hr tablet   Oral pain - Primary    Refill mobic      Relevant Medications   meloxicam (MOBIC) 15 MG tablet      Follow-up: Return for annual exam, fasting.  Donato Schultz, DO

## 2016-06-15 NOTE — Assessment & Plan Note (Signed)
Refill mobic 

## 2016-06-15 NOTE — Patient Instructions (Signed)

## 2016-06-15 NOTE — Assessment & Plan Note (Signed)
con't wellbutrin  

## 2016-10-28 ENCOUNTER — Telehealth: Payer: Self-pay | Admitting: Family Medicine

## 2016-10-28 ENCOUNTER — Encounter: Payer: Self-pay | Admitting: Family Medicine
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
## 2016-10-28 NOTE — Telephone Encounter (Signed)
No charge. 

## 2016-12-03 ENCOUNTER — Ambulatory Visit (INDEPENDENT_AMBULATORY_CARE_PROVIDER_SITE_OTHER): Payer: Self-pay | Admitting: Family Medicine

## 2016-12-03 ENCOUNTER — Encounter: Payer: Self-pay | Admitting: Family Medicine

## 2016-12-03 VITALS — BP 116/64 | HR 99 | Temp 98.1°F | Resp 14 | Ht 64.0 in | Wt 184.0 lb

## 2016-12-03 DIAGNOSIS — E0590 Thyrotoxicosis, unspecified without thyrotoxic crisis or storm: Secondary | ICD-10-CM

## 2016-12-03 DIAGNOSIS — Z0000 Encounter for general adult medical examination without abnormal findings: Secondary | ICD-10-CM

## 2016-12-03 DIAGNOSIS — F988 Other specified behavioral and emotional disorders with onset usually occurring in childhood and adolescence: Secondary | ICD-10-CM

## 2016-12-03 DIAGNOSIS — Z23 Encounter for immunization: Secondary | ICD-10-CM

## 2016-12-03 DIAGNOSIS — F321 Major depressive disorder, single episode, moderate: Secondary | ICD-10-CM

## 2016-12-03 LAB — CBC WITH DIFFERENTIAL/PLATELET
BASOS ABS: 38 {cells}/uL (ref 0–200)
Basophils Relative: 1 %
EOS ABS: 152 {cells}/uL (ref 15–500)
EOS PCT: 4 %
HCT: 38.5 % (ref 35.0–45.0)
Hemoglobin: 13.1 g/dL (ref 11.7–15.5)
LYMPHS ABS: 1482 {cells}/uL (ref 850–3900)
Lymphocytes Relative: 39 %
MCH: 28.9 pg (ref 27.0–33.0)
MCHC: 34 g/dL (ref 32.0–36.0)
MCV: 85 fL (ref 80.0–100.0)
MONO ABS: 342 {cells}/uL (ref 200–950)
MPV: 10.3 fL (ref 7.5–12.5)
Monocytes Relative: 9 %
NEUTROS ABS: 1786 {cells}/uL (ref 1500–7800)
Neutrophils Relative %: 47 %
Platelets: 197 10*3/uL (ref 140–400)
RBC: 4.53 MIL/uL (ref 3.80–5.10)
RDW: 13 % (ref 11.0–15.0)
WBC: 3.8 10*3/uL (ref 3.8–10.8)

## 2016-12-03 LAB — COMPREHENSIVE METABOLIC PANEL
ALT: 26 U/L (ref 6–29)
AST: 17 U/L (ref 10–35)
Albumin: 4.1 g/dL (ref 3.6–5.1)
Alkaline Phosphatase: 86 U/L (ref 33–130)
BUN: 15 mg/dL (ref 7–25)
CHLORIDE: 105 mmol/L (ref 98–110)
CO2: 25 mmol/L (ref 20–31)
CREATININE: 0.72 mg/dL (ref 0.50–1.05)
Calcium: 9 mg/dL (ref 8.6–10.4)
Glucose, Bld: 75 mg/dL (ref 65–99)
Potassium: 4 mmol/L (ref 3.5–5.3)
SODIUM: 140 mmol/L (ref 135–146)
Total Bilirubin: 0.3 mg/dL (ref 0.2–1.2)
Total Protein: 6.5 g/dL (ref 6.1–8.1)

## 2016-12-03 LAB — POC URINALSYSI DIPSTICK (AUTOMATED)
Bilirubin, UA: NEGATIVE
Blood, UA: NEGATIVE
GLUCOSE UA: NEGATIVE
KETONES UA: NEGATIVE
LEUKOCYTES UA: NEGATIVE
PROTEIN UA: NEGATIVE
Spec Grav, UA: 1.025 (ref 1.010–1.025)
Urobilinogen, UA: 0.2 E.U./dL
pH, UA: 6 (ref 5.0–8.0)

## 2016-12-03 LAB — LIPID PANEL
CHOLESTEROL: 160 mg/dL (ref <200)
HDL: 35 mg/dL — ABNORMAL LOW (ref >50)
LDL Cholesterol: 71 mg/dL (ref <100)
TRIGLYCERIDES: 268 mg/dL — AB (ref <150)
Total CHOL/HDL Ratio: 4.6 Ratio (ref <5.0)
VLDL: 54 mg/dL — AB (ref <30)

## 2016-12-03 MED ORDER — METHYLPHENIDATE HCL 20 MG PO TABS: 20.0000 mg | ORAL_TABLET | Freq: Two times a day (BID) | ORAL | 0 refills | Status: DC

## 2016-12-03 MED ORDER — BUPROPION HCL ER (XL) 300 MG PO TB24: 300.0000 mg | ORAL_TABLET | Freq: Every day | ORAL | 3 refills | Status: DC

## 2016-12-03 MED ORDER — ZOSTER VAC RECOMB ADJUVANTED 50 MCG/0.5ML IM SUSR: 0.5000 mL | Freq: Once | INTRAMUSCULAR | 1 refills | Status: AC

## 2016-12-03 NOTE — Patient Instructions (Signed)
Preventive Care 40-64 Years, Female Preventive care refers to lifestyle choices and visits with your health care provider that can promote health and wellness. What does preventive care include?  A yearly physical exam. This is also called an annual well check.  Dental exams once or twice a year.  Routine eye exams. Ask your health care provider how often you should have your eyes checked.  Personal lifestyle choices, including: ? Daily care of your teeth and gums. ? Regular physical activity. ? Eating a healthy diet. ? Avoiding tobacco and drug use. ? Limiting alcohol use. ? Practicing safe sex. ? Taking low-dose aspirin daily starting at age 58. ? Taking vitamin and mineral supplements as recommended by your health care provider. What happens during an annual well check? The services and screenings done by your health care provider during your annual well check will depend on your age, overall health, lifestyle risk factors, and family history of disease. Counseling Your health care provider may ask you questions about your:  Alcohol use.  Tobacco use.  Drug use.  Emotional well-being.  Home and relationship well-being.  Sexual activity.  Eating habits.  Work and work Statistician.  Method of birth control.  Menstrual cycle.  Pregnancy history.  Screening You may have the following tests or measurements:  Height, weight, and BMI.  Blood pressure.  Lipid and cholesterol levels. These may be checked every 5 years, or more frequently if you are over 81 years old.  Skin check.  Lung cancer screening. You may have this screening every year starting at age 78 if you have a 30-pack-year history of smoking and currently smoke or have quit within the past 15 years.  Fecal occult blood test (FOBT) of the stool. You may have this test every year starting at age 65.  Flexible sigmoidoscopy or colonoscopy. You may have a sigmoidoscopy every 5 years or a colonoscopy  every 10 years starting at age 30.  Hepatitis C blood test.  Hepatitis B blood test.  Sexually transmitted disease (STD) testing.  Diabetes screening. This is done by checking your blood sugar (glucose) after you have not eaten for a while (fasting). You may have this done every 1-3 years.  Mammogram. This may be done every 1-2 years. Talk to your health care provider about when you should start having regular mammograms. This may depend on whether you have a family history of breast cancer.  BRCA-related cancer screening. This may be done if you have a family history of breast, ovarian, tubal, or peritoneal cancers.  Pelvic exam and Pap test. This may be done every 3 years starting at age 80. Starting at age 36, this may be done every 5 years if you have a Pap test in combination with an HPV test.  Bone density scan. This is done to screen for osteoporosis. You may have this scan if you are at high risk for osteoporosis.  Discuss your test results, treatment options, and if necessary, the need for more tests with your health care provider. Vaccines Your health care provider may recommend certain vaccines, such as:  Influenza vaccine. This is recommended every year.  Tetanus, diphtheria, and acellular pertussis (Tdap, Td) vaccine. You may need a Td booster every 10 years.  Varicella vaccine. You may need this if you have not been vaccinated.  Zoster vaccine. You may need this after age 5.  Measles, mumps, and rubella (MMR) vaccine. You may need at least one dose of MMR if you were born in  1957 or later. You may also need a second dose.  Pneumococcal 13-valent conjugate (PCV13) vaccine. You may need this if you have certain conditions and were not previously vaccinated.  Pneumococcal polysaccharide (PPSV23) vaccine. You may need one or two doses if you smoke cigarettes or if you have certain conditions.  Meningococcal vaccine. You may need this if you have certain  conditions.  Hepatitis A vaccine. You may need this if you have certain conditions or if you travel or work in places where you may be exposed to hepatitis A.  Hepatitis B vaccine. You may need this if you have certain conditions or if you travel or work in places where you may be exposed to hepatitis B.  Haemophilus influenzae type b (Hib) vaccine. You may need this if you have certain conditions.  Talk to your health care provider about which screenings and vaccines you need and how often you need them. This information is not intended to replace advice given to you by your health care provider. Make sure you discuss any questions you have with your health care provider. Document Released: 06/27/2015 Document Revised: 02/18/2016 Document Reviewed: 04/01/2015 Elsevier Interactive Patient Education  2017 [employer].

## 2016-12-03 NOTE — Progress Notes (Signed)
Pre visit review using our clinic review tool, if applicable. No additional management support is needed unless otherwise documented below in the visit note. Subjective:     Cindy Stephens is a 51 y.o. female and is here for a comprehensive physical exam. The patient reports no problems.  Social History   Social History  . Marital status: Married    Spouse name: N/A  . Number of children: N/A  . Years of education: N/A   Occupational History  . Not on file.   Social History Main Topics  . Smoking status: Never Smoker  . Smokeless tobacco: Never Used  . Alcohol use 0.0 oz/week     Comment: Occ  . Drug use: No  . Sexual activity: Yes    Partners: Male   Other Topics Concern  . Not on file   Social History Narrative   Lives with husband and children in a 2 story home.  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  Education: BS   Health Maintenance  Topic Date Due  . HIV Screening  12/26/1979  . TETANUS/TDAP  06/14/2014  . MAMMOGRAM  12/26/2014  . COLONOSCOPY  12/26/2014  . INFLUENZA VACCINE  01/12/2017  . PAP SMEAR  03/05/2017    The following portions of the patient's history were reviewed and updated as appropriate: She  has a past medical history of Allergic rhinitis; Depression; GERD (gastroesophageal reflux disease); Shoulder dislocation (2007); and Urinary incontinence. She  does not have any pertinent problems on file. She  has a past surgical history that includes Elbow surgery (Left, 2007) and Appendectomy. Her family history includes Alzheimer's disease in her father; Arthritis in her father, mother, and sister; Diabetes in her paternal grandmother; Hyperlipidemia in her father; Hypertension in her father; Osteoarthritis in her brother. She  reports that she has never smoked. She has never used smokeless tobacco. She reports that she drinks alcohol. She reports that she does not use drugs. She has a current medication list which includes the following prescription(s):  aspirin, bupropion, co-enzyme q-10, dhea, diazepam, diclofenac sodium, fluticasone, glucosamine-chondroitin, lidocaine, loratadine, magnesium oxide, maitake mushroom, meloxicam, methylphenidate, methylphenidate, methylphenidate, misc natural products, montelukast, niacin, NONFORMULARY OR COMPOUNDED ITEM, fish oil, omeprazole, potassium, probiotic product, tazorac, turmeric, vitamin b-12, zolpidem, fluoxetine, melatonin, and pseudoephedrine. Current Outpatient Prescriptions on File Prior to Visit  Medication Sig Dispense Refill  . aspirin 81 MG tablet Take 81 mg by mouth daily.    Marland Kitchen co-enzyme Q-10 30 MG capsule Take 30 mg by mouth 3 (three) times daily.    Marland Kitchen DHEA 10 MG TABS Take by mouth.    . diazepam (VALIUM) 5 MG tablet Take 1 tablet (5 mg total) by mouth 3 (three) times daily as needed for anxiety. 40 tablet 0  . Diclofenac Sodium 2 % SOLN Apply twice daily. 112 g 1  . fluticasone (FLONASE) 50 MCG/ACT nasal spray Place into both nostrils daily.    Marland Kitchen glucosamine-chondroitin 500-400 MG tablet Take 1 tablet by mouth 2 (two) times daily.    Marland Kitchen lidocaine (XYLOCAINE) 5 % ointment Apply 1 application topically 3 (three) times daily as needed. Use gloves when applying to left leg. 35.44 g 3  . loratadine (CLARITIN) 10 MG tablet Take 10 mg by mouth daily.    . magnesium oxide (MAG-OX) 400 MG tablet Take 400 mg by mouth daily.    Nathanial Millman Mushroom POWD by Does not apply route.    . meloxicam (MOBIC) 15 MG tablet Take 1 tablet (15 mg  total) by mouth daily. 90 tablet 3  . Misc Natural Products (PROGESTERONE EX) Apply topically daily.    . montelukast (SINGULAIR) 10 MG tablet Take 1 tablet (10 mg total) by mouth at bedtime. 90 tablet 3  . NIACIN CR PO Take by mouth.    . NONFORMULARY OR COMPOUNDED ITEM Lipid, hep-- hyperlipidemia 1 each 0  . Omega-3 Fatty Acids (FISH OIL) 1000 MG CAPS Take 1 capsule by mouth.    Marland Kitchen omeprazole (PRILOSEC OTC) 20 MG tablet Take 20 mg by mouth daily.    . Potassium 99 MG TABS  Take by mouth.    . Probiotic Product (PROBIOTIC DAILY PO) Take by mouth.    Marland Kitchen TAZORAC 0.05 % cream   5  . Turmeric 500 MG CAPS Take by mouth.    . vitamin B-12 (CYANOCOBALAMIN) 1000 MCG tablet Take 1,000 mcg by mouth daily.    Marland Kitchen zolpidem (AMBIEN) 5 MG tablet Take 1 tablet (5 mg total) by mouth at bedtime as needed for sleep. 30 tablet 0  . FLUoxetine (PROZAC) 10 MG capsule Take 1 capsule (10 mg total) by mouth daily. (Patient not taking: Reported on 12/03/2016) 90 capsule 3  . Melatonin 10 MG CAPS Take by mouth.    . pseudoephedrine (SUDAFED) 30 MG tablet Take 30 mg by mouth every 4 (four) hours as needed for congestion.     No current facility-administered medications on file prior to visit.    She is allergic to sulfa antibiotics..  Review of Systems Review of Systems  Constitutional: Negative for activity change, appetite change and fatigue.  HENT: Negative for hearing loss, congestion, tinnitus and ear discharge.  dentist q70mEyes: Negative for visual disturbance (see optho q1y -- vision corrected to 20/20 with glasses).  Respiratory: Negative for cough, chest tightness and shortness of breath.   Cardiovascular: Negative for chest pain, palpitations and leg swelling.  Gastrointestinal: Negative for abdominal pain, diarrhea, constipation and abdominal distention.  Genitourinary: Negative for urgency, frequency, decreased urine volume and difficulty urinating.  Musculoskeletal: Negative for back pain, arthralgias and gait problem.  Skin: Negative for color change, pallor and rash.  Neurological: Negative for dizziness, light-headedness, numbness and headaches.  Hematological: Negative for adenopathy. Does not bruise/bleed easily.  Psychiatric/Behavioral: Negative for suicidal ideas, confusion, sleep disturbance, self-injury, dysphoric mood, decreased concentration and agitation.       Objective:    BP 116/64 (BP Location: Left Arm, Patient Position: Sitting, Cuff Size: Normal)    Pulse 99   Temp 98.1 F (36.7 C) (Oral)   Resp 14   Ht 5' 4"  (1.626 m)   Wt 184 lb (83.5 kg)   SpO2 98%   BMI 31.58 kg/m  General appearance: alert, cooperative, appears stated age and no distress Head: Normocephalic, without obvious abnormality, atraumatic Eyes: conjunctivae/corneas clear. PERRL, EOM's intact. Fundi benign. Ears: normal TM's and external ear canals both ears Nose: Nares normal. Septum midline. Mucosa normal. No drainage or sinus tenderness. Throat: lips, mucosa, and tongue normal; teeth and gums normal Neck: no adenopathy, no carotid bruit, no JVD, supple, symmetrical, trachea midline and thyroid not enlarged, symmetric, no tenderness/mass/nodules Back: symmetric, no curvature. ROM normal. No CVA tenderness. Lungs: clear to auscultation bilaterally Breasts: normal appearance, no masses or tenderness Heart: regular rate and rhythm, S1, S2 normal, no murmur, click, rub or gallop Abdomen: soft, non-tender; bowel sounds normal; no masses,  no organomegaly Pelvic: deferred Extremities: extremities normal, atraumatic, no cyanosis or edema Pulses: 2+ and symmetric Skin: Skin color,  texture, turgor normal. No rashes or lesions Lymph nodes: Cervical, supraclavicular, and axillary nodes normal. Neurologic: Alert and oriented X 3, normal strength and tone. Normal symmetric reflexes. Normal coordination and gait    Assessment:    Healthy female exam.      Plan:     ghm utd Check labs  See After Visit Summary for Counseling Recommendations    1. Attention deficit disorder, unspecified hyperactivity presence stable - methylphenidate (RITALIN) 20 MG tablet; Take 1 tablet (20 mg total) by mouth 2 (two) times daily.  Dispense: 60 tablet; Refill: 0 - methylphenidate (RITALIN) 20 MG tablet; Take 1 tablet (20 mg total) by mouth 2 (two) times daily.  Dispense: 60 tablet; Refill: 0 - methylphenidate (RITALIN) 20 MG tablet; Take 1 tablet (20 mg total) by mouth 2 (two) times  daily with breakfast and lunch.  Dispense: 60 tablet; Refill: 0 - CBC w/Diff - Comp Met (CMET) - Lipid panel - TSH  2. Moderate single current episode of major depressive disorder ([facility]) Per psych - buPROPion (WELLBUTRIN XL) 300 MG 24 hr tablet; Take 1 tablet (300 mg total) by mouth daily.  Dispense: 90 tablet; Refill: 3 - CBC w/Diff - Comp Met (CMET) - Lipid panel - TSH  3. Need for shingles vaccine  - Zoster Vac Recomb Adjuvanted [facility]) injection; Inject 0.5 mLs into the muscle once. Repeat in 2-6 months  Dispense: 0.5 mL; Refill: 1 - CBC w/Diff - Comp Met (CMET) - Lipid panel - TSH  4. Preventative health care See above - POCT Urinalysis Dipstick (Automated) - CBC w/Diff - Comp Met (CMET) - Lipid panel - TSH

## 2016-12-04 LAB — TSH: TSH: 0.01 mIU/L — ABNORMAL LOW

## 2016-12-09 MED ORDER — FENOFIBRATE 160 MG PO TABS: 160.0000 mg | ORAL_TABLET | Freq: Every day | ORAL | 2 refills | Status: DC

## 2016-12-09 NOTE — Addendum Note (Signed)
Addended byConrad [place]: Cindy Stephens D on: 12/09/2016 11:58 AM   Modules accepted: Orders

## 2017-02-01 ENCOUNTER — Ambulatory Visit (INDEPENDENT_AMBULATORY_CARE_PROVIDER_SITE_OTHER): Payer: Self-pay | Admitting: Family Medicine

## 2017-02-01 ENCOUNTER — Encounter: Payer: Self-pay | Admitting: Family Medicine

## 2017-02-01 ENCOUNTER — Ambulatory Visit: Payer: Self-pay

## 2017-02-01 VITALS — BP 128/80 | HR 89 | Ht 64.0 in | Wt 188.0 lb

## 2017-02-01 DIAGNOSIS — M25562 Pain in left knee: Secondary | ICD-10-CM

## 2017-02-01 DIAGNOSIS — S83282A Other tear of lateral meniscus, current injury, left knee, initial encounter: Secondary | ICD-10-CM

## 2017-02-01 NOTE — Patient Instructions (Addendum)
Good to see you  Ice 20 minutes 2 times daily. Usually after activity and before bed. Exercises 3 times a week.  Wear brace daily for at least 2 weeks Avoid any twisting motions.  meloxicam daily for 10 days then as needed pennsaid pinkie amount topically 2 times daily as needed.   See me again in 3-4 weeks.

## 2017-02-01 NOTE — Assessment & Plan Note (Signed)
Patient was given an injection. Tolerated the procedure well. We discussed icing regimen and home exercises. Discussed which activities in which ones to avoid. Patient given a brace today. Will return in 3 weeks.

## 2017-02-01 NOTE — Progress Notes (Signed)
[name] [address] [place], [place] [address] Phone: [phone] Subjective:    I'm seeing this patient by the request  of:    CC: Left knee pain  WGN:[id number]  Cindy Stephens is a 51 y.o. female coming in with complaint of left knee pain. Patient states Saturday she was with family and unfortunate had inaudible pop in her knee. Swelling within hours. Patient states and notes difficulty even moving the knee. Rates the severity of pain is 8 out of 10. All seems to be on the lateral aspect of the knee. Possible swelling has noticed some decreasing range of motion. Has tried some ice as well as some tramadol from her mother with no improvement.     Past Medical History:  Diagnosis Date  . Allergic rhinitis   . Depression   . GERD (gastroesophageal reflux disease)   . Shoulder dislocation 2007   Left Shoulder  . Urinary incontinence    Past Surgical History:  Procedure Laterality Date  . APPENDECTOMY    . ELBOW SURGERY Left 2007   elbow was shattered   Social History   Social History  . Marital status: Married    Spouse name: N/A  . Number of children: N/A  . Years of education: N/A   Social History Main Topics  . Smoking status: Never Smoker  . Smokeless tobacco: Never Used  . Alcohol use 0.0 oz/week     Comment: Occ  . Drug use: No  . Sexual activity: Yes    Partners: Male   Other Topics Concern  . None   Social History Narrative   Lives with husband and children in a 2 story home.  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  Education: BS   Allergies  Allergen Reactions  . Sulfa Antibiotics Hives and Nausea Only   Family History  Problem Relation Age of Onset  . Arthritis Father   . Hyperlipidemia Father   . Alzheimer's disease Father   . Hypertension Father        Paternal Family  . Arthritis Mother   . Arthritis Unknown   . Osteoarthritis Brother   . Arthritis Sister   . Diabetes Paternal Grandmother        PGGM  .  Heart disease Unknown        Pateternal Family  . Stroke Unknown        Paternal Family     Past medical history, social, surgical and family history all reviewed in electronic medical record.  No pertanent information unless stated regarding to the chief complaint.   Review of Systems:Review of systems updated and as accurate as of 02/01/17  No headache, visual changes, nausea, vomiting, diarrhea, constipation, dizziness, abdominal pain, skin rash, fevers, chills, night sweats, weight loss, swollen lymph nodes, body aches, joint swelling, chest pain, shortness of breath, mood changes. Positive muscle aches  Objective  Blood pressure 128/80, pulse 89, height 5\' 4"  (1.626 m), weight 188 lb (85.3 kg), SpO2 96 %. Systems examined below as of 02/01/17   General: No apparent distress alert and oriented x3 mood and affect normal, dressed appropriately.  HEENT: Pupils equal, extraocular movements intact  Respiratory: Patient's speak in full sentences and does not appear short of breath  Cardiovascular: No lower extremity edema, non tender, no erythema  Skin: Warm dry intact with no signs of infection or rash on extremities or on axial skeleton.  Abdomen: Soft nontender  Neuro: Cranial nerves II through XII  are intact, neurovascularly intact in all extremities with 2+ DTRs and 2+ pulses.  Lymph: No lymphadenopathy of posterior or anterior cervical chain or axillae bilaterally.  Gait Antalgic gait  MSK:  Non tender with full range of motion and good stability and symmetric strength and tone of shoulders, elbows, wrist, hip, and ankles bilaterally.  Knee left Effusion noted Mild pain over the patellofemoral Lacks last 10 of flexion Ligaments with solid consistent endpoints including ACL, PCL, LCL, MCL. Negative Mcmurray's, Apley's, and Thessalonian tests. painful patellar compression. Patellar glide without crepitus. Patellar and quadriceps tendons unremarkable. Hamstring and quadriceps  strength is normal.  Contralateral knee unremarkable  MSK US performed of: Left knee This study was ordered, performed, and interpreted by Terrilee Files D.O.  Knee: Large effusion noted of the patellofemoral joint. Patient also has what appears to be a large lateral meniscal tear with displacement noted. Hypoechoic changes and increasing Doppler flow noted.  IMPRESSION:  Large lateral meniscal tear noted an approximate 80% of the meniscus with displacement.  Procedure: Real-time Ultrasound Guided Injection of left knee Device: GE Logiq Q7 Ultrasound guided injection is preferred based studies that show increased duration, increased effect, greater accuracy, decreased procedural pain, increased response rate, and decreased cost with ultrasound guided versus blind injection.  Verbal informed consent obtained.  Time-out conducted.  Noted no overlying erythema, induration, or other signs of local infection.  Skin prepped in a sterile fashion.  Local anesthesia: Topical Ethyl chloride.  With sterile technique and under real time ultrasound guidance: With a 22-gauge 2 inch needle patient was injected with 4 cc of 0.5% Marcaine and aspirated 15 mL of strawlike fluid then injected 1 cc of Kenalog 40 mg/dL. This was from a superior lateral approach.  Completed without difficulty  Pain immediately resolved suggesting accurate placement of the medication.  Advised to call if fevers/chills, erythema, induration, drainage, or persistent bleeding.  Images permanently stored and available for review in the ultrasound unit.  Impression: Technically successful ultrasound guided injection.   Impression and Recommendations:     This case required medical decision making of moderate complexity.      Note: This dictation was prepared with Dragon dictation along with smaller phrase technology. Any transcriptional errors that result from this process are unintentional.

## 2017-02-28 ENCOUNTER — Encounter: Payer: Self-pay | Admitting: Family Medicine

## 2017-02-28 ENCOUNTER — Ambulatory Visit (INDEPENDENT_AMBULATORY_CARE_PROVIDER_SITE_OTHER): Payer: Self-pay | Admitting: Family Medicine

## 2017-02-28 DIAGNOSIS — M1712 Unilateral primary osteoarthritis, left knee: Secondary | ICD-10-CM

## 2017-02-28 DIAGNOSIS — S83282D Other tear of lateral meniscus, current injury, left knee, subsequent encounter: Secondary | ICD-10-CM

## 2017-02-28 NOTE — Assessment & Plan Note (Signed)
Likely doing well and well healed. We discussed icing regimen and home exercises. I do think that there is some patellar subluxation and some mild arthritis that's likely contribute in. Patient will continue with conservative therapy. Follow-up with me again in 8-10 weeks

## 2017-02-28 NOTE — Progress Notes (Signed)
[name] [address] [place], [place] [address] Phone: [phone] Subjective:    I'm seeing this patient by the request  of:    CC: left knee pain follow up   BJY:[id number]  Cindy Stephens is a 51 y.o. female coming in with complaint of left knee pain. Found to have a large lateral meniscal tear of the knee with an effusion. Given injection 1 month ago. Patient states Doing much better overall. Still having some significant discomfort more over the anterior aspect of the knee. Patient rates the severity of pain as 5 out of 10. Patient states sometimes it feels like the knee Itself is unstable.      Past Medical History:  Diagnosis Date  . Allergic rhinitis   . Depression   . GERD (gastroesophageal reflux disease)   . Shoulder dislocation 2007   Left Shoulder  . Urinary incontinence    Past Surgical History:  Procedure Laterality Date  . APPENDECTOMY    . ELBOW SURGERY Left 2007   elbow was shattered   Social History   Social History  . Marital status: Married    Spouse name: N/A  . Number of children: N/A  . Years of education: N/A   Social History Main Topics  . Smoking status: Never Smoker  . Smokeless tobacco: Never Used  . Alcohol use 0.0 oz/week     Comment: Occ  . Drug use: No  . Sexual activity: Yes    Partners: Male   Other Topics Concern  . None   Social History Narrative   Lives with husband and children in a 2 story home.  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  Education: BS   Allergies  Allergen Reactions  . Sulfa Antibiotics Hives and Nausea Only   Family History  Problem Relation Age of Onset  . Arthritis Father   . Hyperlipidemia Father   . Alzheimer's disease Father   . Hypertension Father        Paternal Family  . Arthritis Mother   . Arthritis Unknown   . Osteoarthritis Brother   . Arthritis Sister   . Diabetes Paternal Grandmother        PGGM  . Heart disease Unknown        Pateternal Family  .  Stroke Unknown        Paternal Family     Past medical history, social, surgical and family history all reviewed in electronic medical record.  No pertanent information unless stated regarding to the chief complaint.   Review of Systems:Review of systems updated and as accurate as of 02/28/17  No headache, visual changes, nausea, vomiting, diarrhea, constipation, dizziness, abdominal pain, skin rash, fevers, chills, night sweats, weight loss, swollen lymph nodes, body aches, joint swelling, muscle aches, chest pain, shortness of breath, mood changes. Positive muscle aches  Objective  Blood pressure 130/74, pulse 94, height  (1.626 m), weight 187 lb (84.8 kg), SpO2 98 %. Systems examined below as of 02/28/17   General: No apparent distress alert and oriented x3 mood and affect normal, dressed appropriately.  HEENT: Pupils equal, extraocular movements intact  Respiratory: Patient's speak in full sentences and does not appear short of breath  Cardiovascular: No lower extremity edema, non tender, no erythema  Skin: Warm dry intact with no signs of infection or rash on extremities or on axial skeleton.  Abdomen: Soft nontender  Neuro: Cranial nerves II through XII are intact, neurovascularly intact in all  extremities with 2+ DTRs and 2+ pulses.  Lymph: No lymphadenopathy of posterior or anterior cervical chain or axillae bilaterally.  Gait normal with good balance and coordination.  MSK:  Non tender with full range of motion and good stability and symmetric strength and tone of shoulders, elbows, wrist, hip, and ankles bilaterally.  Knee: Left Mild lateral tilt to the patella noted Palpation normal with no warmth, joint line tenderness, patellar tenderness, or condyle tenderness. ROM full in flexion and extension and lower leg rotation. Ligaments with solid consistent endpoints including ACL, PCL, LCL, MCL. Negative Mcmurray's, Apley's, and Thessalonian tests. painful patellar  compression. Patellar glide with moderate crepitus. Patellar and quadriceps tendons unremarkable. Hamstring and quadriceps strength is normal.     Impression and Recommendations:     This case required medical decision making of moderate complexity.      Note: This dictation was prepared with Dragon dictation along with smaller phrase technology. Any transcriptional errors that result from this process are unintentional.

## 2017-02-28 NOTE — Patient Instructions (Addendum)
Good to see you  I am glad the knee is doing better overall.  I think more patellofemoral arthritis.  Look for the brace we discussed Exercises 3 times a week.  Avoid activity with a lot of squatting.  See me again in 6-8 weeks if not better.

## 2017-02-28 NOTE — Assessment & Plan Note (Signed)
Patellofemoral Syndrome  Reviewed anatomy using anatomical model and how PFS occurs.  Given rehab exercises handout for VMO, hip abductors, core, entire kinetic chain including proprioception exercises including cone touches, step downs, hip elevations and turn outs.  Could benefit from PT, regular exercise, upright biking, and a PFS knee brace to assist with tracking abnormalities. RTC in 8-10 weeks.

## 2017-03-22 ENCOUNTER — Telehealth: Payer: Self-pay | Admitting: Family Medicine

## 2017-03-22 DIAGNOSIS — G4700 Insomnia, unspecified: Secondary | ICD-10-CM

## 2017-03-22 DIAGNOSIS — F988 Other specified behavioral and emotional disorders with onset usually occurring in childhood and adolescence: Secondary | ICD-10-CM

## 2017-03-22 MED ORDER — ZOLPIDEM TARTRATE 5 MG PO TABS
5.0000 mg | ORAL_TABLET | Freq: Every evening | ORAL | 1 refills | Status: DC | PRN
Start: 2017-03-22 — End: 2018-01-06

## 2017-03-22 MED ORDER — METHYLPHENIDATE HCL 20 MG PO TABS: 20.0000 mg | ORAL_TABLET | Freq: Two times a day (BID) | ORAL | 0 refills | Status: DC

## 2017-03-22 NOTE — Telephone Encounter (Signed)
Ok to refill ritalin for 3 months and ambien x1 with 1 refill

## 2017-03-22 NOTE — Telephone Encounter (Signed)
Ritalin Rx's for October, November and December 2018 printed; Ambien Rx printed, awaiting DO signature.

## 2017-03-22 NOTE — Telephone Encounter (Signed)
  Please see Pt requests and messages below: ?   zolpidem (AMBIEN) 5 MG tablet [Yvonne R Lowne Chase, DO]   Patient Comment: After extensive time away from home and out of my own bed this summer, I am running low on the [place]. I would appreciate one refill of this prior to the Christmas holidays, as we'll be traveling again for the holiday and this really helps with all of the various hotels and sleeping locations that change frequently. This should hold me until next summer (2019).     methylphenidate (RITALIN) 20 MG tablet Donato Schultz, DO]   Patient Comment: May I come in and pick up the 2nd set of scripts for the Ritalin? Months 4, 5, 6?     methylphenidate (RITALIN) 20 MG tablet [Yvonne R Zola Button, DO]   Patient Comment: see above?     methylphenidate (RITALIN) 20 MG tablet [Yvonne R Zola Button, DO]   Patient Comment: see above?    Preferred pharmacy: [facility] [address] [place], [place] - [address]

## 2017-04-19 ENCOUNTER — Other Ambulatory Visit: Payer: Self-pay

## 2017-04-19 ENCOUNTER — Ambulatory Visit: Payer: Self-pay

## 2017-04-19 ENCOUNTER — Ambulatory Visit: Payer: Self-pay | Admitting: Family Medicine

## 2017-04-19 ENCOUNTER — Encounter: Payer: Self-pay | Admitting: Family Medicine

## 2017-04-19 VITALS — BP 118/82 | HR 95 | Ht 60.0 in | Wt 188.0 lb

## 2017-04-19 DIAGNOSIS — M25562 Pain in left knee: Secondary | ICD-10-CM

## 2017-04-19 DIAGNOSIS — M1712 Unilateral primary osteoarthritis, left knee: Secondary | ICD-10-CM

## 2017-04-19 NOTE — Progress Notes (Signed)
[facility] B[facility] Gadson D.O. [facility] [address] [place], [place] [address] Phone: [phone] Subjective:     CC: Left knee follow-up  ION:[id number]:Subjective  Cindy Stephens is a 51 y.o. female coming in with complaint of left knee pain.  Found to have a lateral meniscal injury but also seemed to have a subluxation of the patella with osteoarthritic changes.  Patient elected to continue with conservative therapy with home exercises, icing regimen, topical anti-inflammatories and bracing.  Patient states that she is not in a lot of pain. The patella still is bothersome and patient states that it won't stay in place.  Patient continues to have some swelling as well.  This will be a lifelong problem.      Past Medical History:  Diagnosis Date  . Allergic rhinitis   . Depression   . GERD (gastroesophageal reflux disease)   . Shoulder dislocation 2007   Left Shoulder  . Urinary incontinence    Past Surgical History:  Procedure Laterality Date  . APPENDECTOMY    . ELBOW SURGERY Left 2007   elbow was shattered   Social History   Socioeconomic History  . Marital status: Married    Spouse name: Not on file  . Number of children: Not on file  . Years of education: Not on file  . Highest education level: Not on file  Social Needs  . Financial resource strain: Not on file  . Food insecurity - worry: Not on file  . Food insecurity - inability: Not on file  . Transportation needs - medical: Not on file  . Transportation needs - non-medical: Not on file  Occupational History  . Not on file  Tobacco Use  . Smoking status: Never Smoker  . Smokeless tobacco: Never Used  Substance and Sexual Activity  . Alcohol use: Yes    Alcohol/week: 0.0 oz    Comment: Occ  . Drug use: No  . Sexual activity: Yes    Partners: Male  Other Topics Concern  . Not on file  Social History Narrative   Lives with husband and children in a 2 story home.  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  Education: BS    Allergies  Allergen Reactions  . Sulfa Antibiotics Hives and Nausea Only   Family History  Problem Relation Age of Onset  . Arthritis Father   . Hyperlipidemia Father   . Alzheimer's disease Father   . Hypertension Father        Paternal Family  . Arthritis Mother   . Arthritis Unknown   . Osteoarthritis Brother   . Arthritis Sister   . Diabetes Paternal Grandmother        PGGM  . Heart disease Unknown        Pateternal Family  . Stroke Unknown        Paternal Family     Past medical history, social, surgical and family history all reviewed in electronic medical record.  No pertanent information unless stated regarding to the chief complaint.   Review of Systems:Review of systems updated and as accurate as of 04/19/17  No headache, visual changes, nausea, vomiting, diarrhea, constipation, dizziness, abdominal pain, skin rash, fevers, chills, night sweats, weight loss, swollen lymph nodes, body aches, joint swelling, muscle aches, chest pain, shortness of breath, mood changes.   Objective  There were no vitals taken for this visit. Systems examined below as of 04/19/17   General: No apparent distress alert and oriented x3 mood and affect  normal, dressed appropriately.  HEENT: Pupils equal, extraocular movements intact  Respiratory: Patient's speak in full sentences and does not appear short of breath  Cardiovascular: No lower extremity edema, non tender, no erythema  Skin: Warm dry intact with no signs of infection or rash on extremities or on axial skeleton.  Abdomen: Soft nontender  Neuro: Cranial nerves II through XII are intact, neurovascularly intact in all extremities with 2+ DTRs and 2+ pulses.  Lymph: No lymphadenopathy of posterior or anterior cervical chain or axillae bilaterally.  Gait normal with good balance and coordination.  MSK:  Non tender with full range of motion and good stability and symmetric strength and tone of shoulders, elbows, wrist, hip, and  ankles bilaterally.  Knee: valgus deformity noted.  Abnormal thigh to calf ratio.  Tender to palpation over  PF joint line laterally only ROM full in flexion and extension and lower leg rotation. instability with valgus force.  painful patellar compression. Patellar glide with moderate crepitus. Patellar and quadriceps tendons unremarkable. Hamstring and quadriceps strength is normal. Contralateral knee shows minimal arthritic changes.  Procedure: Real-time Ultrasound Guided Injection of left knee Device: GE Logiq Q7 Ultrasound guided injection is preferred based studies that show increased duration, increased effect, greater accuracy, decreased procedural pain, increased response rate, and decreased cost with ultrasound guided versus blind injection.  Verbal informed consent obtained.  Time-out conducted.  Noted no overlying erythema, induration, or other signs of local infection.  Skin prepped in a sterile fashion.  Local anesthesia: Topical Ethyl chloride.  With sterile technique and under real time ultrasound guidance: With a 22-gauge 2 inch needle patient was injected with 4 cc of 0.5% Marcaine and aspirated 20 cc of straw-colored fluid and injected 1 cc of Kenalog 40 mg/dL. This was from a superior lateral approach.  Completed without difficulty  Pain immediately resolved suggesting accurate placement of the medication.  Advised to call if fevers/chills, erythema, induration, drainage, or persistent bleeding.  Images permanently stored and available for review in the ultrasound unit.  Impression: Technically successful ultrasound guided injection.   Impression and Recommendations:     This case required medical decision making of moderate complexity.      Note: This dictation was prepared with Dragon dictation along with smaller phrase technology. Any transcriptional errors that result from this process are unintentional.

## 2017-04-19 NOTE — Patient Instructions (Signed)
Good to see you  Drained the knee today  Ice I s your friend.  Stay active.  Try the brace or look for one similar.  Continue the vitamins See me again in 6-8 weeks to make sure you are doing well

## 2017-04-19 NOTE — Assessment & Plan Note (Signed)
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  Patient did have increased amount of effusion and will send down to the lab to rule out any other possible cause such as uric acid.  We discussed icing regimen, home exercise, which activities are doing which wants to avoid.  Patient is in agreement with the plan.  She was given a different brace for more of a patellar stability.  Hopefully this will be beneficial.  Follow-up with me again in 4-6 weeks.

## 2017-04-20 LAB — SYNOVIAL CELL COUNT + DIFF, W/ CRYSTALS
BASOPHILS, %: 0 %
Eosinophils-Synovial: 0 % (ref 0–2)
Lymphocytes-Synovial Fld: 82 % — ABNORMAL HIGH (ref 0–74)
Monocyte/Macrophage: 16 % (ref 0–69)
NEUTROPHIL, SYNOVIAL: 2 % (ref 0–24)
Synoviocytes, %: 0 % (ref 0–15)
WBC, Synovial: 125 cells/uL (ref <150)

## 2017-05-03 ENCOUNTER — Telehealth: Payer: Self-pay | Admitting: Family Medicine

## 2017-05-03 ENCOUNTER — Other Ambulatory Visit (INDEPENDENT_AMBULATORY_CARE_PROVIDER_SITE_OTHER): Payer: Self-pay

## 2017-05-03 DIAGNOSIS — G4700 Insomnia, unspecified: Secondary | ICD-10-CM

## 2017-05-03 DIAGNOSIS — F988 Other specified behavioral and emotional disorders with onset usually occurring in childhood and adolescence: Secondary | ICD-10-CM

## 2017-05-03 NOTE — Telephone Encounter (Signed)
Copied from CRM [phone]. Topic: Quick Communication - See Telephone Encounter >> May 03, 2017  9:04 AM Guinevere FerrariMorris, Ariel Dimitri E, NT wrote: CRM for notification. See Telephone encounter for: Pt wants to know if she can come in today or tomorrow to pick up second half of her prescription for methylphenidate (RITALIN) 20 MG tablet. Pt would like a call back when medication is ready for pick up. Pt. Is out of medication  05/03/17.

## 2017-05-03 NOTE — Telephone Encounter (Signed)
She was given rx for oct, nov, dec -- so I'm not sure what she means by the other half

## 2017-05-04 LAB — PAIN MGMT, PROFILE 8 W/CONF, U
6 Acetylmorphine: NEGATIVE ng/mL (ref <10)
ALCOHOL METABOLITES: NEGATIVE ng/mL (ref <500)
Amphetamines: NEGATIVE ng/mL (ref <500)
BENZODIAZEPINES: NEGATIVE ng/mL (ref <100)
Buprenorphine, Urine: NEGATIVE ng/mL (ref <5)
COCAINE METABOLITE: NEGATIVE ng/mL (ref <150)
Creatinine: 91.1 mg/dL
MDMA: NEGATIVE ng/mL (ref <500)
Marijuana Metabolite: NEGATIVE ng/mL (ref <20)
OXIDANT: NEGATIVE ug/mL (ref <200)
OXYCODONE: NEGATIVE ng/mL (ref <100)
Opiates: NEGATIVE ng/mL (ref <100)
pH: 6.37 (ref 4.5–9.0)

## 2017-05-09 ENCOUNTER — Ambulatory Visit (INDEPENDENT_AMBULATORY_CARE_PROVIDER_SITE_OTHER): Payer: Self-pay | Admitting: Family Medicine

## 2017-05-09 ENCOUNTER — Encounter: Payer: Self-pay | Admitting: Family Medicine

## 2017-05-09 VITALS — BP 118/70 | HR 85 | Temp 98.3°F | Resp 16 | Ht 64.0 in | Wt 192.2 lb

## 2017-05-09 DIAGNOSIS — J302 Other seasonal allergic rhinitis: Secondary | ICD-10-CM

## 2017-05-09 DIAGNOSIS — N951 Menopausal and female climacteric states: Secondary | ICD-10-CM

## 2017-05-09 DIAGNOSIS — L700 Acne vulgaris: Secondary | ICD-10-CM

## 2017-05-09 DIAGNOSIS — Z8619 Personal history of other infectious and parasitic diseases: Secondary | ICD-10-CM

## 2017-05-09 LAB — LUTEINIZING HORMONE: LH: 21.65 m[IU]/mL

## 2017-05-09 LAB — FOLLICLE STIMULATING HORMONE: FSH: 58 m[IU]/mL

## 2017-05-09 MED ORDER — CONJ ESTROG-MEDROXYPROGEST ACE 0.3-1.5 MG PO TABS: 1.0000 | ORAL_TABLET | Freq: Every day | ORAL | 11 refills | Status: DC

## 2017-05-09 MED ORDER — FLUTICASONE PROPIONATE 50 MCG/ACT NA SUSP: 2.0000 | Freq: Every day | NASAL | 2 refills | Status: AC

## 2017-05-09 MED ORDER — TAZAROTENE 0.05 % EX CREA: TOPICAL_CREAM | Freq: Every day | CUTANEOUS | 5 refills | Status: DC

## 2017-05-09 MED ORDER — VALACYCLOVIR HCL 1 G PO TABS
ORAL_TABLET | ORAL | 2 refills | Status: DC
Start: 2017-05-09 — End: 2019-01-18

## 2017-05-09 NOTE — Assessment & Plan Note (Signed)
Pt having 50 or more episodes a day Will start HRT-- pt understands risks/ benefits

## 2017-05-09 NOTE — Progress Notes (Signed)
Patient ID: Cindy Stephens, female   DOB: 06/22/1965, 51 y.o.   MRN: [id number]     Subjective:  I acted as a Neurosurgeon for Dr. Zola Button.  Apolonio Schneiders, CMA   Patient ID: Cindy Stephens, female    DOB: 1966/04/28, 72 y.o.   MRN: [id number]  Chief Complaint  Patient presents with  . Hot Flashes    cannot sleep 30-50 at night  . fever blister    HPI  Patient is in today for hot flashes.  She has been having them for months.  She has been having 30-50 a night.  She would like to start some kind of hormone therapy.  She would also like an prescription for her fever blisters when she gets them. She also needs med refills.   Patient Care Team: Zola Button, Grayling Congress, DO as PCP - General (Family Medicine)   Past Medical History:  Diagnosis Date  . Allergic rhinitis   . Depression   . GERD (gastroesophageal reflux disease)   . Shoulder dislocation 2007   Left Shoulder  . Urinary incontinence     Past Surgical History:  Procedure Laterality Date  . APPENDECTOMY    . ELBOW SURGERY Left 2007   elbow was shattered    Family History  Problem Relation Age of Onset  . Arthritis Father   . Hyperlipidemia Father   . Alzheimer's disease Father   . Hypertension Father        Paternal Family  . Arthritis Mother   . Arthritis Unknown   . Osteoarthritis Brother   . Arthritis Sister   . Diabetes Paternal Grandmother        PGGM  . Heart disease Unknown        Pateternal Family  . Stroke Unknown        Paternal Family    Social History   Socioeconomic History  . Marital status: Married    Spouse name: Not on file  . Number of children: Not on file  . Years of education: Not on file  . Highest education level: Not on file  Social Needs  . Financial resource strain: Not on file  . Food insecurity - worry: Not on file  . Food insecurity - inability: Not on file  . Transportation needs - medical: Not on file  . Transportation needs - non-medical: Not on file  Occupational History    . Not on file  Tobacco Use  . Smoking status: Never Smoker  . Smokeless tobacco: Never Used  Substance and Sexual Activity  . Alcohol use: Yes    Alcohol/week: 0.0 oz    Comment: Occ  . Drug use: No  . Sexual activity: Yes    Partners: Male  Other Topics Concern  . Not on file  Social History Narrative   Lives with husband and children in a 2 story home.  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  Education: BS    Outpatient Medications Prior to Visit  Medication Sig Dispense Refill  . aspirin 81 MG tablet Take 81 mg by mouth daily.    Marland Kitchen buPROPion (WELLBUTRIN XL) 300 MG 24 hr tablet Take 1 tablet (300 mg total) by mouth daily. 90 tablet 3  . co-enzyme Q-10 30 MG capsule Take 30 mg by mouth 3 (three) times daily.    . Collagen Hydrolysate, Bovine, POWD by Does not apply route.    Marland Kitchen DHEA 10 MG TABS Take by mouth.    . diazepam (VALIUM) 5 MG  tablet Take 1 tablet (5 mg total) by mouth 3 (three) times daily as needed for anxiety. 40 tablet 0  . Diclofenac Sodium 2 % SOLN Apply twice daily. 112 g 1  . glucosamine-chondroitin 500-400 MG tablet Take 1 tablet by mouth 2 (two) times daily.    Marland Kitchen lidocaine (XYLOCAINE) 5 % ointment Apply 1 application topically 3 (three) times daily as needed. Use gloves when applying to left leg. 35.44 g 3  . loratadine (CLARITIN) 10 MG tablet Take 10 mg by mouth daily.    . magnesium oxide (MAG-OX) 400 MG tablet Take 400 mg by mouth daily.    Carlene Coria Mushroom POWD by Does not apply route.    . meloxicam (MOBIC) 15 MG tablet Take 1 tablet (15 mg total) by mouth daily. 90 tablet 3  . methylphenidate (RITALIN) 20 MG tablet Take 1 tablet (20 mg total) by mouth 2 (two) times daily. 60 tablet 0  . methylphenidate (RITALIN) 20 MG tablet Take 1 tablet (20 mg total) by mouth 2 (two) times daily. 60 tablet 0  . methylphenidate (RITALIN) 20 MG tablet Take 1 tablet (20 mg total) by mouth 2 (two) times daily with breakfast and lunch. 60 tablet 0  . Misc Natural Products  (PROGESTERONE EX) Apply topically daily.    . montelukast (SINGULAIR) 10 MG tablet Take 1 tablet (10 mg total) by mouth at bedtime. 90 tablet 3  . NIACIN CR PO Take by mouth.    . NONFORMULARY OR COMPOUNDED ITEM Lipid, hep-- hyperlipidemia 1 each 0  . Omega-3 Fatty Acids (FISH OIL) 1000 MG CAPS Take 1 capsule by mouth.    Marland Kitchen omeprazole (PRILOSEC OTC) 20 MG tablet Take 20 mg by mouth daily.    . Potassium 99 MG TABS Take by mouth.    . Probiotic Product (PROBIOTIC DAILY PO) Take by mouth.    . pseudoephedrine (SUDAFED) 30 MG tablet Take 30 mg by mouth every 4 (four) hours as needed for congestion.    . Turmeric 500 MG CAPS Take by mouth.    . vitamin B-12 (CYANOCOBALAMIN) 1000 MCG tablet Take 1,000 mcg by mouth daily.    Marland Kitchen zolpidem (AMBIEN) 5 MG tablet Take 1 tablet (5 mg total) by mouth at bedtime as needed for sleep. 30 tablet 1  . fluticasone (FLONASE) 50 MCG/ACT nasal spray Place into both nostrils daily.    Marland Kitchen TAZORAC 0.05 % cream   5  . fenofibrate 160 MG tablet Take 1 tablet (160 mg total) by mouth daily. 30 tablet 2  . FLUoxetine (PROZAC) 10 MG capsule Take 1 capsule (10 mg total) by mouth daily. 90 capsule 3  . Melatonin 10 MG CAPS Take by mouth.     No facility-administered medications prior to visit.     Allergies  Allergen Reactions  . Sulfa Antibiotics Hives and Nausea Only    Review of Systems  Constitutional: Negative for fever and malaise/fatigue.  HENT: Negative for congestion.   Eyes: Negative for blurred vision.  Respiratory: Negative for cough and shortness of breath.   Cardiovascular: Negative for chest pain, palpitations and leg swelling.  Gastrointestinal: Negative for vomiting.  Musculoskeletal: Negative for back pain.  Skin: Negative for rash.  Neurological: Negative for loss of consciousness and headaches.  Endo/Heme/Allergies:       Hot flashes        Objective:    Physical Exam  Constitutional: She is oriented to person, place, and time. She  appears well-developed and well-nourished.  HENT:  Head: Normocephalic and atraumatic.  Eyes: Conjunctivae and EOM are normal.  Neck: Normal range of motion. Neck supple. No JVD present. Carotid bruit is not present. No thyromegaly present.  Cardiovascular: Normal rate, regular rhythm and normal heart sounds.  No murmur heard. Pulmonary/Chest: Effort normal and breath sounds normal. No respiratory distress. She has no wheezes. She has no rales. She exhibits no tenderness.  Musculoskeletal: She exhibits no edema.  Neurological: She is alert and oriented to person, place, and time.  Psychiatric: She has a normal mood and affect.  Nursing note and vitals reviewed.   BP 118/70 (BP Location: Left Arm, Cuff Size: Normal)   Pulse 85   Temp 98.3 F (36.8 C) (Oral)   Resp 16   Ht 5\' 4"  (1.626 m)   Wt 192 lb 3.2 oz (87.2 kg)   LMP 06/13/2016   SpO2 97%   BMI 32.99 kg/m  Wt Readings from Last 3 Encounters:  05/09/17 192 lb 3.2 oz (87.2 kg)  04/19/17 188 lb (85.3 kg)  02/28/17 187 lb (84.8 kg)   BP Readings from Last 3 Encounters:  05/09/17 118/70  04/19/17 118/82  02/28/17 130/74     Immunization History  Administered Date(s) Administered  . Influenza-Unspecified 01/31/2014, 04/15/2015, 06/16/2016, 04/13/2017  . Tdap 06/14/2004    Health Maintenance  Topic Date Due  . HIV Screening  12/26/1979  . TETANUS/TDAP  06/14/2014  . MAMMOGRAM  12/26/2014  . COLONOSCOPY  12/26/2014  . PAP SMEAR  03/05/2017  . INFLUENZA VACCINE  Completed    Lab Results  Component Value Date   WBC 3.8 12/03/2016   HGB 13.1 12/03/2016   HCT 38.5 12/03/2016   PLT 197 12/03/2016   GLUCOSE 75 12/03/2016   CHOL 160 12/03/2016   TRIG 268 (H) 12/03/2016   HDL 35 (L) 12/03/2016   LDLCALC 71 12/03/2016   ALT 26 12/03/2016   AST 17 12/03/2016   NA 140 12/03/2016   K 4.0 12/03/2016   CL 105 12/03/2016   CREATININE 0.72 12/03/2016   BUN 15 12/03/2016   CO2 25 12/03/2016   TSH 0.01 (L) 12/03/2016      Lab Results  Component Value Date   TSH 0.01 (L) 12/03/2016   Lab Results  Component Value Date   WBC 3.8 12/03/2016   HGB 13.1 12/03/2016   HCT 38.5 12/03/2016   MCV 85.0 12/03/2016   PLT 197 12/03/2016   Lab Results  Component Value Date   NA 140 12/03/2016   K 4.0 12/03/2016   CO2 25 12/03/2016   GLUCOSE 75 12/03/2016   BUN 15 12/03/2016   CREATININE 0.72 12/03/2016   BILITOT 0.3 12/03/2016   ALKPHOS 86 12/03/2016   AST 17 12/03/2016   ALT 26 12/03/2016   PROT 6.5 12/03/2016   ALBUMIN 4.1 12/03/2016   CALCIUM 9.0 12/03/2016   GFR 76.53 03/05/2014   Lab Results  Component Value Date   CHOL 160 12/03/2016   Lab Results  Component Value Date   HDL 35 (L) 12/03/2016   Lab Results  Component Value Date   LDLCALC 71 12/03/2016   Lab Results  Component Value Date   TRIG 268 (H) 12/03/2016   Lab Results  Component Value Date   CHOLHDL 4.6 12/03/2016   No results found for: HGBA1C       Assessment & Plan:   Problem List Items Addressed This Visit    None    Visit Diagnoses    Acne vulgaris    -  Primary   Relevant Medications   tazarotene (TAZORAC) 0.05 % cream   Seasonal allergies       Relevant Medications   fluticasone (FLONASE) 50 MCG/ACT nasal spray   Hot flashes due to menopause       Relevant Medications   estrogen, conjugated,-medroxyprogesterone (PREMPRO) 0.3-1.5 MG tablet   Other Relevant Orders   Estradiol   FSH   LH   Hx of cold sores       Relevant Medications   valACYclovir (VALTREX) 1000 MG tablet      I have discontinued Camarie R. Mordan's fluticasone, Melatonin, FLUoxetine, and fenofibrate. I have also changed her TAZORAC to tazarotene. Additionally, I am having her start on fluticasone, estrogen (conjugated)-medroxyprogesterone, and valACYclovir. Lastly, I am having her maintain her loratadine, pseudoephedrine, omeprazole, glucosamine-chondroitin, Fish Oil, DHEA, aspirin, co-enzyme Q-10, vitamin B-12, Probiotic Product  (PROBIOTIC DAILY PO), Potassium, magnesium oxide, Turmeric, Maitake Mushroom, Misc Natural Products (PROGESTERONE EX), lidocaine, NIACIN CR PO, Diclofenac Sodium, NONFORMULARY OR COMPOUNDED ITEM, diazepam, meloxicam, montelukast, buPROPion, Collagen Hydrolysate (Bovine), methylphenidate, methylphenidate, methylphenidate, and zolpidem.  Meds ordered this encounter  Medications  . tazarotene (TAZORAC) 0.05 % cream    Sig: Apply topically at bedtime.    Dispense:  30 g    Refill:  5  . fluticasone (FLONASE) 50 MCG/ACT nasal spray    Sig: Place 2 sprays into both nostrils daily.    Dispense:  16 g    Refill:  2  . estrogen, conjugated,-medroxyprogesterone (PREMPRO) 0.3-1.5 MG tablet    Sig: Take 1 tablet by mouth daily.    Dispense:  30 tablet    Refill:  11  . valACYclovir (VALTREX) 1000 MG tablet    Sig: As directed    Dispense:  30 tablet    Refill:  2    CMA served as scribe during this visit. History, Physical and Plan performed by medical provider. Documentation and orders reviewed and attested to.  Donato SchultzYvonne R Lowne Chase, DO

## 2017-05-09 NOTE — Patient Instructions (Signed)

## 2017-05-10 LAB — ESTRADIOL: ESTRADIOL: 45 pg/mL

## 2017-05-23 ENCOUNTER — Ambulatory Visit: Payer: Self-pay | Admitting: Family Medicine

## 2017-06-21 ENCOUNTER — Ambulatory Visit: Payer: Self-pay | Admitting: Family Medicine

## 2017-10-20 ENCOUNTER — Other Ambulatory Visit: Payer: Self-pay | Admitting: Family Medicine

## 2017-10-20 DIAGNOSIS — Z789 Other specified health status: Secondary | ICD-10-CM

## 2017-11-15 ENCOUNTER — Other Ambulatory Visit: Payer: Self-pay | Admitting: Family Medicine

## 2017-11-15 DIAGNOSIS — F988 Other specified behavioral and emotional disorders with onset usually occurring in childhood and adolescence: Secondary | ICD-10-CM

## 2017-11-15 NOTE — Telephone Encounter (Signed)
Copied from CRM [id number]. Topic: Quick Communication - Rx Refill/Question >> Nov 15, 2017 12:11 PM Raquel SarnaHayes, Cindy G wrote: methylphenidate (RITALIN) 20 MG tablet  Pt needing refill - if Rx cannot be called/escripted in please call pt to let her know if she needs to come by to pick up the written one. May leave a vm message. Needing to know if she needs to make an appt w/ Dr/ Laury AxonLowne. Please call pt back on this as well.   [facility] [address] - [place], [place] - [address] [place] [place] [id number] Phone: [phone] Fax: [phone]

## 2017-11-16 NOTE — Telephone Encounter (Signed)
Rx refill request: Ritalin 20 mg      Last filled: 03/22/17 ( patient gets 3 Rx)  LOV: 05/09/17  PCP: Lowne-Chase  Pharmacy: verified  Patient needs to know when her next medication follow up is due.

## 2017-11-17 NOTE — Telephone Encounter (Signed)
Requesting: methylphenidate (ritalin) 20mg  bid Contract: none found UDS: 05-03-2017 Last OV: 05-09-17 Next OV: none Last refill: 03/22/2017 Database:   Please advise.

## 2018-01-06 ENCOUNTER — Encounter: Payer: Self-pay | Admitting: Family Medicine

## 2018-01-06 ENCOUNTER — Ambulatory Visit (INDEPENDENT_AMBULATORY_CARE_PROVIDER_SITE_OTHER): Payer: Self-pay | Admitting: Family Medicine

## 2018-01-06 DIAGNOSIS — G4700 Insomnia, unspecified: Secondary | ICD-10-CM

## 2018-01-06 DIAGNOSIS — Z789 Other specified health status: Secondary | ICD-10-CM

## 2018-01-06 DIAGNOSIS — F988 Other specified behavioral and emotional disorders with onset usually occurring in childhood and adolescence: Secondary | ICD-10-CM

## 2018-01-06 DIAGNOSIS — F321 Major depressive disorder, single episode, moderate: Secondary | ICD-10-CM

## 2018-01-06 MED ORDER — METHYLPHENIDATE HCL 20 MG PO TABS: 20.0000 mg | ORAL_TABLET | Freq: Two times a day (BID) | ORAL | 0 refills | Status: DC

## 2018-01-06 MED ORDER — BUPROPION HCL ER (XL) 300 MG PO TB24: 300.0000 mg | ORAL_TABLET | Freq: Every day | ORAL | 3 refills | Status: DC

## 2018-01-06 MED ORDER — ZOLPIDEM TARTRATE 5 MG PO TABS: 5.0000 mg | ORAL_TABLET | Freq: Every evening | ORAL | 1 refills | Status: DC | PRN

## 2018-01-06 MED ORDER — MONTELUKAST SODIUM 10 MG PO TABS: 10.0000 mg | ORAL_TABLET | Freq: Every day | ORAL | 1 refills | Status: DC

## 2018-01-06 MED ORDER — METHYLPHENIDATE HCL 20 MG PO TABS
20.0000 mg | ORAL_TABLET | Freq: Two times a day (BID) | ORAL | 0 refills | Status: DC
Start: 2018-01-06 — End: 2018-07-13

## 2018-01-06 NOTE — Patient Instructions (Signed)

## 2018-01-07 NOTE — Assessment & Plan Note (Signed)
Stable con't meds 

## 2018-01-07 NOTE — Progress Notes (Signed)
Patient ID: Cindy Stephens, female    DOB: 01-28-1966  Age: 52 y.o. MRN: [id number]    Subjective:  Subjective  HPI Cindy Stephens presents for f/u and med refills.  She has had to use ambien a little more frequently.    Review of Systems  Constitutional: Negative for activity change, appetite change, fatigue and unexpected weight change.  Respiratory: Negative for cough and shortness of breath.   Cardiovascular: Negative for chest pain and palpitations.  Psychiatric/Behavioral: Negative for behavioral problems and dysphoric mood. The patient is not nervous/anxious.     History Past Medical History:  Diagnosis Date  . Allergic rhinitis   . Depression   . GERD (gastroesophageal reflux disease)   . Shoulder dislocation 2007   Left Shoulder  . Urinary incontinence     She has a past surgical history that includes Elbow surgery (Left, 2007) and Appendectomy.   Her family history includes Alzheimer's disease in her father; Arthritis in her father, mother, sister, and unknown relative; Diabetes in her paternal grandmother; Heart disease in her unknown relative; Hyperlipidemia in her father; Hypertension in her father; Osteoarthritis in her brother; Stroke in her unknown relative.She reports that she has never smoked. She has never used smokeless tobacco. She reports that she drinks alcohol. She reports that she does not use drugs.  Current Outpatient Medications on File Prior to Visit  Medication Sig Dispense Refill  . aspirin 81 MG tablet Take 81 mg by mouth daily.    Marland Kitchen co-enzyme Q-10 30 MG capsule Take 30 mg by mouth 3 (three) times daily.    . Collagen Hydrolysate, Bovine, POWD by Does not apply route.    Marland Kitchen DHEA 10 MG TABS Take by mouth.    . diazepam (VALIUM) 5 MG tablet Take 1 tablet (5 mg total) by mouth 3 (three) times daily as needed for anxiety. 40 tablet 0  . Diclofenac Sodium 2 % SOLN Apply twice daily. 112 g 1  . fluticasone (FLONASE) 50 MCG/ACT nasal spray Place 2 sprays  into both nostrils daily. 16 g 2  . glucosamine-chondroitin 500-400 MG tablet Take 1 tablet by mouth 2 (two) times daily.    Marland Kitchen lidocaine (XYLOCAINE) 5 % ointment Apply 1 application topically 3 (three) times daily as needed. Use gloves when applying to left leg. 35.44 g 3  . loratadine (CLARITIN) 10 MG tablet Take 10 mg by mouth daily.    . magnesium oxide (MAG-OX) 400 MG tablet Take 400 mg by mouth daily.    Carlene Coria Mushroom POWD by Does not apply route.    . meloxicam (MOBIC) 15 MG tablet Take 1 tablet (15 mg total) by mouth daily. 90 tablet 3  . methylphenidate (RITALIN) 20 MG tablet Take 1 tablet (20 mg total) by mouth 2 (two) times daily. 60 tablet 0  . methylphenidate (RITALIN) 20 MG tablet Take 1 tablet (20 mg total) by mouth 2 (two) times daily with breakfast and lunch. 60 tablet 0  . Misc Natural Products (PROGESTERONE EX) Apply topically daily.    Marland Kitchen NIACIN CR PO Take by mouth.    . NONFORMULARY OR COMPOUNDED ITEM Lipid, hep-- hyperlipidemia 1 each 0  . Omega-3 Fatty Acids (FISH OIL) 1000 MG CAPS Take 1 capsule by mouth.    Marland Kitchen omeprazole (PRILOSEC OTC) 20 MG tablet Take 20 mg by mouth daily.    . Potassium 99 MG TABS Take by mouth.    . Probiotic Product (PROBIOTIC DAILY PO) Take by mouth.    Marland Kitchen  pseudoephedrine (SUDAFED) 30 MG tablet Take 30 mg by mouth every 4 (four) hours as needed for congestion.    . tazarotene (TAZORAC) 0.05 % cream Apply topically at bedtime. 30 g 5  . Turmeric 500 MG CAPS Take by mouth.    . valACYclovir (VALTREX) 1000 MG tablet As directed 30 tablet 2  . vitamin B-12 (CYANOCOBALAMIN) 1000 MCG tablet Take 1,000 mcg by mouth daily.     No current facility-administered medications on file prior to visit.      Objective:  Objective  Physical Exam  Constitutional: She is oriented to person, place, and time. She appears well-developed and well-nourished.  HENT:  Head: Normocephalic and atraumatic.  Eyes: Conjunctivae and EOM are normal.  Neck: Normal range  of motion. Neck supple. No JVD present. Carotid bruit is not present. No thyromegaly present.  Cardiovascular: Normal rate, regular rhythm and normal heart sounds.  No murmur heard. Pulmonary/Chest: Effort normal and breath sounds normal. No respiratory distress. She has no wheezes. She has no rales. She exhibits no tenderness.  Musculoskeletal: She exhibits no edema.  Neurological: She is alert and oriented to person, place, and time.  Psychiatric: She has a normal mood and affect.  Nursing note and vitals reviewed.  BP 110/60 (BP Location: Left Arm, Patient Position: Sitting, Cuff Size: Large)   Pulse 92   Temp 97.9 F (36.6 C) (Oral)   Ht 5\' 3"  (1.6 m)   Wt 194 lb 9.6 oz (88.3 kg)   LMP 06/13/2016   SpO2 96%   BMI 34.47 kg/m  Wt Readings from Last 3 Encounters:  01/06/18 194 lb 9.6 oz (88.3 kg)  05/09/17 192 lb 3.2 oz (87.2 kg)  04/19/17 188 lb (85.3 kg)     Lab Results  Component Value Date   WBC 3.8 12/03/2016   HGB 13.1 12/03/2016   HCT 38.5 12/03/2016   PLT 197 12/03/2016   GLUCOSE 75 12/03/2016   CHOL 160 12/03/2016   TRIG 268 (H) 12/03/2016   HDL 35 (L) 12/03/2016   LDLCALC 71 12/03/2016   ALT 26 12/03/2016   AST 17 12/03/2016   NA 140 12/03/2016   K 4.0 12/03/2016   CL 105 12/03/2016   CREATININE 0.72 12/03/2016   BUN 15 12/03/2016   CO2 25 12/03/2016   TSH 0.01 (L) 12/03/2016    No results found.   Assessment & Plan:  Plan  I have discontinued Jameya R. Heacox's estrogen (conjugated)-medroxyprogesterone. I have also changed her montelukast. Additionally, I am having her start on methylphenidate and methylphenidate. Lastly, I am having her maintain her loratadine, pseudoephedrine, omeprazole, glucosamine-chondroitin, Fish Oil, DHEA, aspirin, co-enzyme Q-10, vitamin B-12, Probiotic Product (PROBIOTIC DAILY PO), Potassium, magnesium oxide, Turmeric, Maitake Mushroom, Misc Natural Products (PROGESTERONE EX), lidocaine, NIACIN CR PO, Diclofenac Sodium,  NONFORMULARY OR COMPOUNDED ITEM, diazepam, meloxicam, Collagen Hydrolysate (Bovine), methylphenidate, methylphenidate, tazarotene, fluticasone, valACYclovir, buPROPion, methylphenidate, and zolpidem.  Meds ordered this encounter  Medications  . buPROPion (WELLBUTRIN XL) 300 MG 24 hr tablet    Sig: Take 1 tablet (300 mg total) by mouth daily.    Dispense:  90 tablet    Refill:  3  . methylphenidate (RITALIN) 20 MG tablet    Sig: Take 1 tablet (20 mg total) by mouth 2 (two) times daily.    Dispense:  60 tablet    Refill:  0    Do not fill until Sept 2019  . montelukast (SINGULAIR) 10 MG tablet    Sig: Take 1 tablet (10  mg total) by mouth at bedtime.    Dispense:  90 tablet    Refill:  1  . zolpidem (AMBIEN) 5 MG tablet    Sig: Take 1 tablet (5 mg total) by mouth at bedtime as needed for sleep.    Dispense:  30 tablet    Refill:  1  . methylphenidate (RITALIN) 20 MG tablet    Sig: Take 1 tablet (20 mg total) by mouth 2 (two) times daily.    Dispense:  60 tablet    Refill:  0    Do not fill until oct 2019  . methylphenidate (RITALIN) 20 MG tablet    Sig: Take 1 tablet (20 mg total) by mouth 2 (two) times daily.    Dispense:  60 tablet    Refill:  0    Problem List Items Addressed This Visit      Unprioritized   ADD (attention deficit disorder) without hyperactivity    Stable con't meds      Allergy history unknown   Relevant Medications   montelukast (SINGULAIR) 10 MG tablet   Attention deficit disorder   Relevant Medications   methylphenidate (RITALIN) 20 MG tablet   methylphenidate (RITALIN) 20 MG tablet   methylphenidate (RITALIN) 20 MG tablet   Insomnia    Refill ambien      Relevant Medications   zolpidem (AMBIEN) 5 MG tablet   Moderate single current episode of major depressive disorder (HCC)   Relevant Medications   buPROPion (WELLBUTRIN XL) 300 MG 24 hr tablet      Follow-up: Return in about 6 months (around 07/09/2018).  Donato Schultz, DO

## 2018-01-07 NOTE — Assessment & Plan Note (Signed)
Refill ambien

## 2018-02-27 ENCOUNTER — Telehealth: Payer: Self-pay

## 2018-02-27 NOTE — Telephone Encounter (Signed)
Author phoned pt. to follow-up on billing concern per PEC report given to author on 9/13 in the late afternoon. Practice administrator has no knowledge of pt's billing concern. Pt. stated she wants explanation on why specific labwork from 04/2017 was ordered, and ultimately thinks that it was unnecessary and does not think she should have been billed for it. Nevertheless, she is uninsured and states she should have been made aware of cost; normally she gets labwork done at [facility] and she gets a discount. When she asked at her 05/09/17 OV what it would cost, pt. stated someone told her that "it should not be any different". Pt. has $200 in collections she cannot afford to pay she says and would like to explore her options in getting a refund, as she would normally receive a discount per pt. report due to her uninsured status. Chartered loss adjusterAuthor routed to Forensic psychologistlab technicians and practice administrator, [place], to advise.

## 2018-02-28 NOTE — Telephone Encounter (Signed)
There is a phone note from 05/03/18 about patient wanting a mediction refill, Pain Management urine was ordered and lab was collected.

## 2018-03-01 NOTE — Telephone Encounter (Signed)
[place], please advise on next steps regarding pt. concern with bills in light of being uninsured. Billing department has been telling pt. To follow up with practice.

## 2018-03-02 NOTE — Telephone Encounter (Signed)
Per [place]il[place]ordan

## 2018-04-10 ENCOUNTER — Telehealth: Payer: Self-pay | Admitting: *Deleted

## 2018-04-10 NOTE — Telephone Encounter (Signed)
Called patient back and had to LVM to try to obtain more information. What is the date of service and is the bill from our labs?

## 2018-04-10 NOTE — Telephone Encounter (Signed)
Copied from CRM [id number]. Topic: General - Other >> Apr 07, 2018  4:37 PM Tamela Oddi wrote: Reason for CRM: Patient called to speak with the office manager regarding a bill for labs that she received.  Patient is uninsured and would like some clarification on the bills she received for her labs.  CB# [phone]

## 2018-04-24 ENCOUNTER — Telehealth: Payer: Self-pay | Admitting: Family Medicine

## 2018-04-24 NOTE — Telephone Encounter (Signed)
Pt calling and states the DOS are 05/03/17 for a drug screening, then 05/09/17 for hormone testing for menapause. She would like to discuss further with [facility].

## 2018-04-24 NOTE — Telephone Encounter (Signed)
Copied from CRM [id number]. Topic: Quick Communication - Rx Refill/Question >> Apr 24, 2018  4:58 PM Baldo Daub L wrote: Medication: methylphenidate (RITALIN) 20 MG tablet  Has the patient contacted their pharmacy? No - states she normally gets a set of written prescriptions for three months at a time. (Agent: If no, request that the patient contact the pharmacy for the refill.) (Agent: If yes, when and what did the pharmacy advise?)  Preferred Pharmacy (with phone number or street name): [facility] [address], [place] - [address] [phone] (Phone) [phone] (Fax)  Agent: Please be advised that RX refills may take up to 3 business days. We ask that you follow-up with your pharmacy.

## 2018-04-25 ENCOUNTER — Other Ambulatory Visit: Payer: Self-pay | Admitting: Family Medicine

## 2018-04-25 DIAGNOSIS — F988 Other specified behavioral and emotional disorders with onset usually occurring in childhood and adolescence: Secondary | ICD-10-CM

## 2018-04-25 MED ORDER — METHYLPHENIDATE HCL 20 MG PO TABS: 20.0000 mg | ORAL_TABLET | Freq: Two times a day (BID) | ORAL | 0 refills | Status: DC

## 2018-04-25 NOTE — Telephone Encounter (Signed)
Will refill Can get contract and uds at [facility]

## 2018-04-25 NOTE — Telephone Encounter (Signed)
Appointment notes updated.

## 2018-04-25 NOTE — Telephone Encounter (Signed)
Called patient back.  Patient states that she called [facility] who advised we do not have a contract with them to discount self-pay patients.   [phone]

## 2018-04-25 NOTE — Telephone Encounter (Signed)
Requesting:Ritalin Contract:none, needs csc UDS:05/03/17 needs updated uds Last Visit:01/06/18 Next Visit:07/13/2018 Last Refill:01/06/18 3 prescriptions   Unable to print controlled substance report.   Please Advise

## 2018-04-28 NOTE — Telephone Encounter (Signed)
Spoke to [employer]-[facility]son[employer]iki at [facility] and gave patient information.  They will be sending the self-pay agreement for me to sign and re-filing charges.  Patient made aware.

## 2018-05-08 ENCOUNTER — Telehealth: Payer: Self-pay

## 2018-05-08 NOTE — Telephone Encounter (Signed)
Copied from CRM [id number]. Topic: General - Inquiry >> May 05, 2018 12:03 PM Crist InfanteHarrald, Kathy J wrote: Reason for CRM:  pt saw Dr Katrinka BlazingSmith last year 04/19/2017 and was charged $117 for a knee brace.  Pt states she paid for in the office.  Pt states she was told she could return in 10 days if she could not wear.  Pt states she could not wear it, and called the company. ([employer] knee brace she thinks) Pt states she was told by the company to keep the brace and they would refund her money to the office and it would be applied to her balance.   Pt states that has never been done, and she is still being charged for her office visit. She states Dr Michaelle CopasSmith's asst was the one who took her credit card for the brace.  She would like to know if you can provide the name/number of the company and if she can access invoice and provide her with any information

## 2018-05-08 NOTE — Telephone Encounter (Signed)
Spoke with brace rep who will contact patient to discuss issue with double billing.

## 2018-05-09 NOTE — Telephone Encounter (Signed)
Spoke with patient. She states that she has not heard from [facility]. I will contact [facility] and see if we can get billing phone number. Patient states that when looking at credit report she saw an outstanding charge from [employer] and figured it was the bill for the brace. I am looking into this as this may not be a [facility] charge. Will let patient know. Patient voices understanding.

## 2018-05-30 NOTE — Telephone Encounter (Signed)
[facility] contacted me and patient will be credited for brace. Will see funds returned to card within next 4-6 weeks. Called patient to let her know.

## 2018-06-21 NOTE — Telephone Encounter (Signed)
Patient states that this bill has went to collections now and is wondering what the status of this was. She would like to speak with Raynelle FanningJulie about this.

## 2018-06-22 ENCOUNTER — Telehealth: Payer: Self-pay | Admitting: *Deleted

## 2018-06-22 NOTE — Telephone Encounter (Signed)
Copied from CRM [id number]. Topic: General - Inquiry >> Jun 21, 2018  4:43 PM Jolayne Haines L wrote: Reason for CRM:pt states she broke her wrist a couple months ago and is wondering if Dr Katrinka Blazing does Cortizone shots for carpel tunnel. Her orthopedic doctor advised her that a shot is the next option before they discuss surgery. She said Dr Katrinka Blazing has given her Cortizone shots before for her bursitis. She has not had a nerve study done and is wondering if this is something Dr Katrinka Blazing could do? She does not have insurance right now. Please Advise.

## 2018-06-22 NOTE — Telephone Encounter (Signed)
No nerve study do ultrasound and can measure nerve and potential injeciton .

## 2018-06-23 NOTE — Telephone Encounter (Signed)
Discussed with pt

## 2018-06-28 NOTE — Telephone Encounter (Signed)
Routing to Lindsay.  

## 2018-07-13 ENCOUNTER — Ambulatory Visit: Payer: Self-pay | Admitting: Family Medicine

## 2018-07-13 ENCOUNTER — Encounter: Payer: Self-pay | Admitting: Family Medicine

## 2018-07-13 VITALS — BP 112/64 | HR 103 | Temp 98.3°F | Ht 63.0 in | Wt 192.0 lb

## 2018-07-13 DIAGNOSIS — Z789 Other specified health status: Secondary | ICD-10-CM

## 2018-07-13 DIAGNOSIS — G4700 Insomnia, unspecified: Secondary | ICD-10-CM

## 2018-07-13 DIAGNOSIS — R059 Cough, unspecified: Secondary | ICD-10-CM

## 2018-07-13 DIAGNOSIS — J0140 Acute pansinusitis, unspecified: Secondary | ICD-10-CM

## 2018-07-13 DIAGNOSIS — R05 Cough: Secondary | ICD-10-CM

## 2018-07-13 DIAGNOSIS — Z79899 Other long term (current) drug therapy: Secondary | ICD-10-CM

## 2018-07-13 DIAGNOSIS — F321 Major depressive disorder, single episode, moderate: Secondary | ICD-10-CM

## 2018-07-13 DIAGNOSIS — F988 Other specified behavioral and emotional disorders with onset usually occurring in childhood and adolescence: Secondary | ICD-10-CM

## 2018-07-13 MED ORDER — METHYLPHENIDATE HCL 20 MG PO TABS: 20.0000 mg | ORAL_TABLET | Freq: Two times a day (BID) | ORAL | 0 refills | Status: DC

## 2018-07-13 MED ORDER — BUPROPION HCL ER (XL) 300 MG PO TB24: 300.0000 mg | ORAL_TABLET | Freq: Every day | ORAL | 3 refills | Status: DC

## 2018-07-13 MED ORDER — MONTELUKAST SODIUM 10 MG PO TABS: 10.0000 mg | ORAL_TABLET | Freq: Every day | ORAL | 1 refills | Status: DC

## 2018-07-13 MED ORDER — AMOXICILLIN-POT CLAVULANATE 875-125 MG PO TABS: 1.0000 | ORAL_TABLET | Freq: Two times a day (BID) | ORAL | 0 refills | Status: DC

## 2018-07-13 MED ORDER — ZOLPIDEM TARTRATE 5 MG PO TABS: 5.0000 mg | ORAL_TABLET | Freq: Every evening | ORAL | 1 refills | Status: DC | PRN

## 2018-07-13 NOTE — Progress Notes (Signed)
Patient ID: Cindy Stephens, female    DOB: 12-24-1965  Age: 53 y.o. MRN: [id number]    Subjective:  Subjective  HPI Cindy Stephens presents for sinus congestion / headache x 1 month---- she is using flonase, claritin and mucinex with no relief  No fever, no bodyaches  She also needs refills on meds  Review of Systems  Constitutional: Positive for chills. Negative for fever.  HENT: Positive for congestion, postnasal drip, rhinorrhea and sinus pressure.   Respiratory: Positive for cough, chest tightness, shortness of breath and wheezing.   Cardiovascular: Negative for chest pain, palpitations and leg swelling.  Allergic/Immunologic: Negative for environmental allergies.    History Past Medical History:  Diagnosis Date  . Allergic rhinitis   . Depression   . GERD (gastroesophageal reflux disease)   . Shoulder dislocation 2007   Left Shoulder  . Urinary incontinence     She has a past surgical history that includes Elbow surgery (Left, 2007) and Appendectomy.   Her family history includes Alzheimer's disease in her father; Arthritis in her father, mother, sister, and unknown relative; Diabetes in her paternal grandmother; Heart disease in her unknown relative; Hyperlipidemia in her father; Hypertension in her father; Osteoarthritis in her brother; Stroke in her unknown relative.She reports that she has never smoked. She has never used smokeless tobacco. She reports current alcohol use. She reports that she does not use drugs.  Current Outpatient Medications on File Prior to Visit  Medication Sig Dispense Refill  . aspirin 81 MG tablet Take 81 mg by mouth daily.    Marland Kitchen co-enzyme Q-10 30 MG capsule Take 30 mg by mouth 3 (three) times daily.    . Collagen Hydrolysate, Bovine, POWD by Does not apply route.    Marland Kitchen DHEA 10 MG TABS Take by mouth.    . diazepam (VALIUM) 5 MG tablet Take 1 tablet (5 mg total) by mouth 3 (three) times daily as needed for anxiety. 40 tablet 0  . Diclofenac Sodium 2 %  SOLN Apply twice daily. 112 g 1  . fluticasone (FLONASE) 50 MCG/ACT nasal spray Place 2 sprays into both nostrils daily. 16 g 2  . glucosamine-chondroitin 500-400 MG tablet Take 1 tablet by mouth 2 (two) times daily.    Marland Kitchen lidocaine (XYLOCAINE) 5 % ointment Apply 1 application topically 3 (three) times daily as needed. Use gloves when applying to left leg. 35.44 g 3  . loratadine (CLARITIN) 10 MG tablet Take 10 mg by mouth daily.    . magnesium oxide (MAG-OX) 400 MG tablet Take 400 mg by mouth daily.    Carlene Coria Mushroom POWD by Does not apply route.    . meloxicam (MOBIC) 15 MG tablet Take 1 tablet (15 mg total) by mouth daily. 90 tablet 3  . Misc Natural Products (PROGESTERONE EX) Apply topically daily.    Marland Kitchen NIACIN CR PO Take by mouth.    . NONFORMULARY OR COMPOUNDED ITEM Lipid, hep-- hyperlipidemia 1 each 0  . Omega-3 Fatty Acids (FISH OIL) 1000 MG CAPS Take 1 capsule by mouth.    Marland Kitchen omeprazole (PRILOSEC OTC) 20 MG tablet Take 20 mg by mouth daily.    . Potassium 99 MG TABS Take by mouth.    . Probiotic Product (PROBIOTIC DAILY PO) Take by mouth.    . pseudoephedrine (SUDAFED) 30 MG tablet Take 30 mg by mouth every 4 (four) hours as needed for congestion.    . tazarotene (TAZORAC) 0.05 % cream Apply topically at bedtime. 30  g 5  . Turmeric 500 MG CAPS Take by mouth.    . valACYclovir (VALTREX) 1000 MG tablet As directed 30 tablet 2  . vitamin B-12 (CYANOCOBALAMIN) 1000 MCG tablet Take 1,000 mcg by mouth daily.     No current facility-administered medications on file prior to visit.      Objective:  Objective  Physical Exam Vitals signs and nursing note reviewed.  Constitutional:      Appearance: She is well-developed.  HENT:     Right Ear: External ear normal.     Left Ear: External ear normal.     Nose:     Right Sinus: Maxillary sinus tenderness and frontal sinus tenderness present.     Left Sinus: Maxillary sinus tenderness and frontal sinus tenderness present.  Eyes:      General:        Right eye: No discharge.        Left eye: No discharge.     Conjunctiva/sclera: Conjunctivae normal.  Cardiovascular:     Rate and Rhythm: Normal rate and regular rhythm.     Heart sounds: Normal heart sounds. No murmur.  Pulmonary:     Effort: Pulmonary effort is normal. No respiratory distress.     Breath sounds: Decreased air movement present. Examination of the right-upper field reveals decreased breath sounds. Decreased breath sounds present. No wheezing or rales.  Chest:     Chest wall: No tenderness.  Lymphadenopathy:     Cervical: Cervical adenopathy present.  Neurological:     Mental Status: She is alert and oriented to person, place, and time.    BP 112/64   Pulse (!) 103   Temp 98.3 F (36.8 C)   Ht 5\' 3"  (1.6 m)   Wt 192 lb (87.1 kg)   LMP 06/13/2016   SpO2 96%   BMI 34.01 kg/m  Wt Readings from Last 3 Encounters:  07/13/18 192 lb (87.1 kg)  01/06/18 194 lb 9.6 oz (88.3 kg)  05/09/17 192 lb 3.2 oz (87.2 kg)     Lab Results  Component Value Date   WBC 3.8 12/03/2016   HGB 13.1 12/03/2016   HCT 38.5 12/03/2016   PLT 197 12/03/2016   GLUCOSE 75 12/03/2016   CHOL 160 12/03/2016   TRIG 268 (H) 12/03/2016   HDL 35 (L) 12/03/2016   LDLCALC 71 12/03/2016   ALT 26 12/03/2016   AST 17 12/03/2016   NA 140 12/03/2016   K 4.0 12/03/2016   CL 105 12/03/2016   CREATININE 0.72 12/03/2016   BUN 15 12/03/2016   CO2 25 12/03/2016   TSH 0.01 (L) 12/03/2016          No results found.   Assessment & Plan:  Plan  I am having Cindy Stephens start on amoxicillin-clavulanate. I am also having her maintain her loratadine, pseudoephedrine, omeprazole, glucosamine-chondroitin, Fish Oil, DHEA, aspirin, co-enzyme Q-10, vitamin B-12, Probiotic Product (PROBIOTIC DAILY PO), Potassium, magnesium oxide, Turmeric, [place] Mushroom, Misc Natural Products (PROGESTERONE EX), lidocaine, NIACIN CR PO, Diclofenac Sodium, NONFORMULARY OR COMPOUNDED ITEM, diazepam,  meloxicam, Collagen Hydrolysate (Bovine), tazarotene, fluticasone, valACYclovir, methylphenidate, methylphenidate, methylphenidate, buPROPion, montelukast, and zolpidem.  Meds ordered this encounter  Medications  . amoxicillin-clavulanate (AUGMENTIN) 875-125 MG tablet    Sig: Take 1 tablet by mouth 2 (two) times daily.    Dispense:  20 tablet    Refill:  0  . DISCONTD: buPROPion (WELLBUTRIN XL) 300 MG 24 hr tablet    Sig: Take 1 tablet (300 mg  total) by mouth daily.    Dispense:  90 tablet    Refill:  3  . methylphenidate (RITALIN) 20 MG tablet    Sig: Take 1 tablet (20 mg total) by mouth 2 (two) times daily.    Dispense:  60 tablet    Refill:  0    March 2020  . methylphenidate (RITALIN) 20 MG tablet    Sig: Take 1 tablet (20 mg total) by mouth 2 (two) times daily.    Dispense:  60 tablet    Refill:  0    Do not fill until Feb 2020  . methylphenidate (RITALIN) 20 MG tablet    Sig: Take 1 tablet (20 mg total) by mouth 2 (two) times daily.    Dispense:  60 tablet    Refill:  0  . DISCONTD: montelukast (SINGULAIR) 10 MG tablet    Sig: Take 1 tablet (10 mg total) by mouth at bedtime.    Dispense:  90 tablet    Refill:  1  . buPROPion (WELLBUTRIN XL) 300 MG 24 hr tablet    Sig: Take 1 tablet (300 mg total) by mouth daily.    Dispense:  90 tablet    Refill:  3  . montelukast (SINGULAIR) 10 MG tablet    Sig: Take 1 tablet (10 mg total) by mouth at bedtime.    Dispense:  90 tablet    Refill:  1  . zolpidem (AMBIEN) 5 MG tablet    Sig: Take 1 tablet (5 mg total) by mouth at bedtime as needed for sleep.    Dispense:  30 tablet    Refill:  1    Problem List Items Addressed This Visit      Unprioritized   Allergy history unknown   Relevant Medications   montelukast (SINGULAIR) 10 MG tablet   Attention deficit disorder    Stable con't meds       Relevant Medications   methylphenidate (RITALIN) 20 MG tablet   methylphenidate (RITALIN) 20 MG tablet   methylphenidate  (RITALIN) 20 MG tablet   Other Relevant Orders   ToxASSURE Select 13 (MW), Urine   Insomnia    Stable meds only prn       Relevant Medications   zolpidem (AMBIEN) 5 MG tablet   Other Relevant Orders   ToxASSURE Select 13 (MW), Urine   Moderate single current episode of major depressive disorder (HCC)    Stable con't meds       Relevant Medications   buPROPion (WELLBUTRIN XL) 300 MG 24 hr tablet    Other Visit Diagnoses    Cough    -  Primary   Relevant Orders   DG Chest 2 View   Acute non-recurrent pansinusitis       Relevant Medications   amoxicillin-clavulanate (AUGMENTIN) 875-125 MG tablet   High risk medication use       Relevant Orders   ToxASSURE Select 13 (MW), Urine      Follow-up: Return in about 6 months (around 01/11/2019), or if symptoms worsen or fail to improve, for annual exam, fasting.  Donato SchultzYvonne R Lowne Chase, DO

## 2018-07-13 NOTE — Patient Instructions (Signed)
Sinusitis, Adult  Sinusitis is inflammation of your sinuses. Sinuses are hollow spaces in the bones around your face. Your sinuses are located:   Around your eyes.   In the middle of your forehead.   Behind your nose.   In your cheekbones.  Mucus normally drains out of your sinuses. When your nasal tissues become inflamed or swollen, mucus can become trapped or blocked. This allows bacteria, viruses, and fungi to grow, which leads to infection. Most infections of the sinuses are caused by a virus.  Sinusitis can develop quickly. It can last for up to 4 weeks (acute) or for more than 12 weeks (chronic). Sinusitis often develops after a cold.  What are the causes?  This condition is caused by anything that creates swelling in the sinuses or stops mucus from draining. This includes:   Allergies.   Asthma.   Infection from bacteria or viruses.   Deformities or blockages in your nose or sinuses.   Abnormal growths in the nose (nasal polyps).   Pollutants, such as chemicals or irritants in the air.   Infection from fungi (rare).  What increases the risk?  You are more likely to develop this condition if you:   Have a weak body defense system (immune system).   Do a lot of swimming or diving.   Overuse nasal sprays.   Smoke.  What are the signs or symptoms?  The main symptoms of this condition are pain and a feeling of pressure around the affected sinuses. Other symptoms include:   Stuffy nose or congestion.   Thick drainage from your nose.   Swelling and warmth over the affected sinuses.   Headache.   Upper toothache.   A cough that may get worse at night.   Extra mucus that collects in the throat or the back of the nose (postnasal drip).   Decreased sense of smell and taste.   Fatigue.   A fever.   Sore throat.   Bad breath.  How is this diagnosed?  This condition is diagnosed based on:   Your symptoms.   Your medical history.   A physical exam.   Tests to find out if your condition is  acute or chronic. This may include:  ? Checking your nose for nasal polyps.  ? Viewing your sinuses using a device that has a light (endoscope).  ? Testing for allergies or bacteria.  ? Imaging tests, such as an MRI or CT scan.  In rare cases, a bone biopsy may be done to rule out more serious types of fungal sinus disease.  How is this treated?  Treatment for sinusitis depends on the cause and whether your condition is chronic or acute.   If caused by a virus, your symptoms should go away on their own within 10 days. You may be given medicines to relieve symptoms. They include:  ? Medicines that shrink swollen nasal passages (topical intranasal decongestants).  ? Medicines that treat allergies (antihistamines).  ? A spray that eases inflammation of the nostrils (topical intranasal corticosteroids).  ? Rinses that help get rid of thick mucus in your nose (nasal saline washes).   If caused by bacteria, your health care provider may recommend waiting to see if your symptoms improve. Most bacterial infections will get better without antibiotic medicine. You may be given antibiotics if you have:  ? A severe infection.  ? A weak immune system.   If caused by narrow nasal passages or nasal polyps, you may need   to have surgery.  Follow these instructions at home:  Medicines   Take, use, or apply over-the-counter and prescription medicines only as told by your health care provider. These may include nasal sprays.   If you were prescribed an antibiotic medicine, take it as told by your health care provider. Do not stop taking the antibiotic even if you start to feel better.  Hydrate and humidify     Drink enough fluid to keep your urine pale yellow. Staying hydrated will help to thin your mucus.   Use a cool mist humidifier to keep the humidity level in your home above 50%.   Inhale steam for 10-15 minutes, 3-4 times a day, or as told by your health care provider. You can do this in the bathroom while a hot shower is  running.   Limit your exposure to cool or dry air.  Rest   Rest as much as possible.   Sleep with your head raised (elevated).   Make sure you get enough sleep each night.  General instructions     Apply a warm, moist washcloth to your face 3-4 times a day or as told by your health care provider. This will help with discomfort.   Wash your hands often with soap and water to reduce your exposure to germs. If soap and water are not available, use hand sanitizer.   Do not smoke. Avoid being around people who are smoking (secondhand smoke).   Keep all follow-up visits as told by your health care provider. This is important.  Contact a health care provider if:   You have a fever.   Your symptoms get worse.   Your symptoms do not improve within 10 days.  Get help right away if:   You have a severe headache.   You have persistent vomiting.   You have severe pain or swelling around your face or eyes.   You have vision problems.   You develop confusion.   Your neck is stiff.   You have trouble breathing.  Summary   Sinusitis is soreness and inflammation of your sinuses. Sinuses are hollow spaces in the bones around your face.   This condition is caused by nasal tissues that become inflamed or swollen. The swelling traps or blocks the flow of mucus. This allows bacteria, viruses, and fungi to grow, which leads to infection.   If you were prescribed an antibiotic medicine, take it as told by your health care provider. Do not stop taking the antibiotic even if you start to feel better.   Keep all follow-up visits as told by your health care provider. This is important.  This information is not intended to replace advice given to you by your health care provider. Make sure you discuss any questions you have with your health care provider.  Document Released: 05/31/2005 Document Revised: 10/31/2017 Document Reviewed: 10/31/2017  Elsevier Interactive Patient Education  2019 [employer]

## 2018-07-14 DIAGNOSIS — J0140 Acute pansinusitis, unspecified: Secondary | ICD-10-CM | POA: Insufficient documentation

## 2018-07-14 NOTE — Assessment & Plan Note (Signed)
Stable con't meds 

## 2018-07-14 NOTE — Telephone Encounter (Signed)
Pt called back stating she is still getting full [facility] after it was to be refiled, per [employer] with [facility], reflecting self-pay discount.  Advised pt to contact [facility] to see if they have refiled and let her know I would contact Viki.  E-mail sent to [facility] attached to original request from November.

## 2018-07-14 NOTE — Assessment & Plan Note (Signed)
abx per orders  flonase / antihistamine otc con't singulair

## 2018-07-14 NOTE — Assessment & Plan Note (Signed)
Stable meds only prn

## 2018-07-18 LAB — TOXASSURE SELECT 13 (MW), URINE

## 2018-07-27 ENCOUNTER — Encounter: Payer: Self-pay | Admitting: Family Medicine

## 2018-07-27 NOTE — Telephone Encounter (Signed)
Spoke with [facility] rep & they have voided the charge.

## 2018-07-31 NOTE — Telephone Encounter (Signed)
I already answered this last week 

## 2018-12-05 ENCOUNTER — Other Ambulatory Visit: Payer: Self-pay | Admitting: Family Medicine

## 2018-12-05 DIAGNOSIS — Z789 Other specified health status: Secondary | ICD-10-CM

## 2019-01-18 ENCOUNTER — Other Ambulatory Visit (HOSPITAL_COMMUNITY)
Admission: RE | Admit: 2019-01-18 | Discharge: 2019-01-18 | Disposition: A | Discharge status: Home / Self Care | Payer: Self-pay | Source: Ambulatory Visit | Attending: Family Medicine | Admitting: Family Medicine

## 2019-01-18 ENCOUNTER — Ambulatory Visit (INDEPENDENT_AMBULATORY_CARE_PROVIDER_SITE_OTHER): Payer: Self-pay | Admitting: Family Medicine

## 2019-01-18 ENCOUNTER — Other Ambulatory Visit: Payer: Self-pay | Admitting: Family Medicine

## 2019-01-18 ENCOUNTER — Other Ambulatory Visit: Payer: Self-pay

## 2019-01-18 ENCOUNTER — Encounter: Payer: Self-pay | Admitting: Family Medicine

## 2019-01-18 ENCOUNTER — Telehealth: Payer: Self-pay | Admitting: Family Medicine

## 2019-01-18 VITALS — BP 124/70 | HR 87 | Temp 98.8°F | Resp 18 | Ht 63.0 in | Wt 198.0 lb

## 2019-01-18 DIAGNOSIS — Z8619 Personal history of other infectious and parasitic diseases: Secondary | ICD-10-CM

## 2019-01-18 DIAGNOSIS — G4700 Insomnia, unspecified: Secondary | ICD-10-CM

## 2019-01-18 DIAGNOSIS — Z789 Other specified health status: Secondary | ICD-10-CM

## 2019-01-18 DIAGNOSIS — F988 Other specified behavioral and emotional disorders with onset usually occurring in childhood and adolescence: Secondary | ICD-10-CM

## 2019-01-18 DIAGNOSIS — Z0000 Encounter for general adult medical examination without abnormal findings: Secondary | ICD-10-CM

## 2019-01-18 DIAGNOSIS — F321 Major depressive disorder, single episode, moderate: Secondary | ICD-10-CM

## 2019-01-18 MED ORDER — ZOLPIDEM TARTRATE 5 MG PO TABS: 5.0000 mg | ORAL_TABLET | Freq: Every evening | ORAL | 1 refills | Status: DC | PRN

## 2019-01-18 MED ORDER — BUPROPION HCL ER (XL) 300 MG PO TB24: 300.0000 mg | ORAL_TABLET | Freq: Every day | ORAL | 3 refills | Status: DC

## 2019-01-18 MED ORDER — MONTELUKAST SODIUM 10 MG PO TABS: 10.0000 mg | ORAL_TABLET | Freq: Every day | ORAL | 0 refills | Status: DC

## 2019-01-18 MED ORDER — METHYLPHENIDATE HCL 20 MG PO TABS: 20.0000 mg | ORAL_TABLET | Freq: Two times a day (BID) | ORAL | 0 refills | Status: DC

## 2019-01-18 MED ORDER — VALACYCLOVIR HCL 1 G PO TABS: ORAL_TABLET | ORAL | 2 refills | Status: DC

## 2019-01-18 MED ORDER — NONFORMULARY OR COMPOUNDED ITEM: 0 refills | Status: AC

## 2019-01-18 NOTE — Progress Notes (Signed)
Subjective:     Cindy Stephens is a 53 y.o. female and is here for a comprehensive physical exam. The patient reports problems - pt needs labs and refills on meds .  Social History   Socioeconomic History  . Marital status: Married    Spouse name: Not on file  . Number of children: Not on file  . Years of education: Not on file  . Highest education level: Not on file  Occupational History  . Not on file  Social Needs  . Financial resource strain: Not on file  . Food insecurity    Worry: Not on file    Inability: Not on file  . Transportation needs    Medical: Not on file    Non-medical: Not on file  Tobacco Use  . Smoking status: Never Smoker  . Smokeless tobacco: Never Used  Substance and Sexual Activity  . Alcohol use: Yes    Alcohol/week: 0.0 standard drinks    Comment: Occ  . Drug use: No  . Sexual activity: Yes    Partners: Male  Lifestyle  . Physical activity    Days per week: Not on file    Minutes per session: Not on file  . Stress: Not on file  Relationships  . Social Musicianconnections    Talks on phone: Not on file    Gets together: Not on file    Attends religious service: Not on file    Active member of club or organization: Not on file    Attends meetings of clubs or organizations: Not on file    Relationship status: Not on file  . Intimate partner violence    Fear of current or ex partner: Not on file    Emotionally abused: Not on file    Physically abused: Not on file    Forced sexual activity: Not on file  Other Topics Concern  . Not on file  Social History Narrative   Lives with husband and children in a 2 story home.  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  Education: BS   Health Maintenance  Topic Date Due  . HIV Screening  12/26/1979  . TETANUS/TDAP  06/14/2014  . MAMMOGRAM  12/26/2014  . COLONOSCOPY  12/26/2014  . PAP SMEAR-Modifier  03/05/2017  . INFLUENZA VACCINE  01/13/2019    The following portions of the patient's history were reviewed  and updated as appropriate:  She  has a past medical history of Allergic rhinitis, Depression, GERD (gastroesophageal reflux disease), Shoulder dislocation (2007), and Urinary incontinence. She does not have any pertinent problems on file. She  has a past surgical history that includes Elbow surgery (Left, 2007) and Appendectomy. Her family history includes Alzheimer's disease in her father; Arthritis in her father, mother, sister, and another family member; Diabetes in her paternal grandmother; Heart disease in an other family member; Hyperlipidemia in her father; Hypertension in her father; Osteoarthritis in her brother; Stroke in an other family member. She  reports that she has never smoked. She has never used smokeless tobacco. She reports current alcohol use. She reports that she does not use drugs. She has a current medication list which includes the following prescription(s): aspirin, bupropion, co-enzyme q-10, collagen hydrolysate (bovine), dhea, diazepam, diclofenac sodium, fluticasone, glucosamine-chondroitin, lidocaine, loratadine, magnesium oxide, maitake mushroom, meloxicam, methylphenidate, methylphenidate, methylphenidate, misc natural products, montelukast, niacin, NONFORMULARY OR COMPOUNDED ITEM, fish oil, omeprazole, potassium, probiotic product, pseudoephedrine, tazarotene, turmeric, vitamin b-12, zolpidem, NONFORMULARY OR COMPOUNDED ITEM, NONFORMULARY OR COMPOUNDED ITEM, and  valacyclovir. Current Outpatient Medications on File Prior to Visit  Medication Sig Dispense Refill  . aspirin 81 MG tablet Take 81 mg by mouth daily.    Marland Kitchen. co-enzyme Q-10 30 MG capsule Take 30 mg by mouth 3 (three) times daily.    . Collagen Hydrolysate, Bovine, POWD by Does not apply route.    Marland Kitchen. DHEA 10 MG TABS Take by mouth.    . diazepam (VALIUM) 5 MG tablet Take 1 tablet (5 mg total) by mouth 3 (three) times daily as needed for anxiety. 40 tablet 0  . Diclofenac Sodium 2 % SOLN Apply twice daily. 112 g 1   . fluticasone (FLONASE) 50 MCG/ACT nasal spray Place 2 sprays into both nostrils daily. 16 g 2  . glucosamine-chondroitin 500-400 MG tablet Take 1 tablet by mouth 2 (two) times daily.    Marland Kitchen. lidocaine (XYLOCAINE) 5 % ointment Apply 1 application topically 3 (three) times daily as needed. Use gloves when applying to left leg. 35.44 g 3  . loratadine (CLARITIN) 10 MG tablet Take 10 mg by mouth daily.    . magnesium oxide (MAG-OX) 400 MG tablet Take 400 mg by mouth daily.    Carlene Coria. [place] Mushroom POWD by Does not apply route.    . meloxicam (MOBIC) 15 MG tablet Take 1 tablet (15 mg total) by mouth daily. 90 tablet 3  . Misc Natural Products (PROGESTERONE EX) Apply topically daily.    Marland Kitchen. NIACIN CR PO Take by mouth.    . NONFORMULARY OR COMPOUNDED ITEM Lipid, hep-- hyperlipidemia 1 each 0  . Omega-3 Fatty Acids (FISH OIL) 1000 MG CAPS Take 1 capsule by mouth.    Marland Kitchen. omeprazole (PRILOSEC OTC) 20 MG tablet Take 20 mg by mouth daily.    . Potassium 99 MG TABS Take by mouth.    . Probiotic Product (PROBIOTIC DAILY PO) Take by mouth.    . pseudoephedrine (SUDAFED) 30 MG tablet Take 30 mg by mouth every 4 (four) hours as needed for congestion.    . tazarotene (TAZORAC) 0.05 % cream Apply topically at bedtime. 30 g 5  . Turmeric 500 MG CAPS Take by mouth.    . vitamin B-12 (CYANOCOBALAMIN) 1000 MCG tablet Take 1,000 mcg by mouth daily.     No current facility-administered medications on file prior to visit.    She is allergic to sulfa antibiotics..  Review of Systems Review of Systems  Constitutional: Negative for activity change, appetite change and fatigue.  HENT: Negative for hearing loss, congestion, tinnitus and ear discharge.  dentist q1933m Eyes: Negative for visual disturbance (see optho q1y -- vision corrected to 20/20 with glasses).  Respiratory: Negative for cough, chest tightness and shortness of breath.   Cardiovascular: Negative for chest pain, palpitations and leg swelling.  Gastrointestinal:  Negative for abdominal pain, diarrhea, constipation and abdominal distention.  Genitourinary: Negative for urgency, frequency, decreased urine volume and difficulty urinating.  Musculoskeletal: Negative for back pain, arthralgias and gait problem.  Skin: Negative for color change, pallor and rash.  Neurological: Negative for dizziness, light-headedness, numbness and headaches.  Hematological: Negative for adenopathy. Does not bruise/bleed easily.  Psychiatric/Behavioral: Negative for suicidal ideas, confusion, sleep disturbance, self-injury, dysphoric mood, decreased concentration and agitation.      Objective:    BP 124/70 (BP Location: Left Arm, Patient Position: Sitting, Cuff Size: Large)   Pulse 87   Temp 98.8 F (37.1 C) (Oral)   Resp 18   Ht 5\' 3"  (1.6 m)   Wt 198  lb (89.8 kg)   LMP 06/13/2016   SpO2 100%   BMI 35.07 kg/m  General appearance: alert, cooperative, appears stated age and no distress Head: Normocephalic, without obvious abnormality, atraumatic Eyes: negative findings: lids and lashes normal, conjunctivae and sclerae normal and pupils equal, round, reactive to light and accomodation Ears: normal TM's and external ear canals both ears Nose: Nares normal. Septum midline. Mucosa normal. No drainage or sinus tenderness. Throat: lips, mucosa, and tongue normal; teeth and gums normal Neck: no adenopathy, no carotid bruit, no JVD, supple, symmetrical, trachea midline and thyroid not enlarged, symmetric, no tenderness/mass/nodules Back: symmetric, no curvature. ROM normal. No CVA tenderness. Lungs: clear to auscultation bilaterally Breasts: normal appearance, no masses or tenderness Heart: regular rate and rhythm, S1, S2 normal, no murmur, click, rub or gallop Abdomen: soft, non-tender; bowel sounds normal; no masses,  no organomegaly Pelvic: cervix normal in appearance, external genitalia normal, no adnexal masses or tenderness, no cervical motion tenderness,  rectovaginal septum normal, uterus normal size, shape, and consistency, vagina normal without discharge and PAP DONE  RECTAL-- HEME NEG BROWN STOOL Extremities: extremities normal, atraumatic, no cyanosis or edema Pulses: 2+ and symmetric Skin: Skin color, texture, turgor normal. No rashes or lesions Lymph nodes: Cervical, supraclavicular, and axillary nodes normal. Neurologic: Alert and oriented X 3, normal strength and tone. Normal symmetric reflexes. Normal coordination and gait    Assessment:    Healthy female exam     Plan:    [facility] UTD CHECK LABS  See After Visit Summary for Counseling Recommendations    1. Preventative health care [facility] utd Check labs  See avs  - NONFORMULARY OR COMPOUNDED ITEM; Cbcd, cmp, lipid, tsh----- dx complete physical exam  Dispense: 1 each; Refill: 0 - NONFORMULARY OR COMPOUNDED ITEM; Free t3 Free t4 Dx preventative health exam  Dispense: 1 each; Refill: 0 - Fecal occult blood, imunochemical; Future - Cytology - PAP( [facility])  2. Insomnia, unspecified type stable - zolpidem (AMBIEN) 5 MG tablet; Take 1 tablet (5 mg total) by mouth at bedtime as needed for sleep.  Dispense: 30 tablet; Refill: 1  3. Hx of cold sores Refill valtrex 4. Allergy history unknown Stable  - montelukast (SINGULAIR) 10 MG tablet; Take 1 tablet (10 mg total) by mouth at bedtime.  Dispense: 90 tablet; Refill: 0  5. Moderate single current episode of major depressive disorder (HCC) Stable  Refill meds  - buPROPion (WELLBUTRIN XL) 300 MG 24 hr tablet; Take 1 tablet (300 mg total) by mouth daily.  Dispense: 90 tablet; Refill: 3  6. Attention deficit disorder, unspecified hyperactivity presence Sable Refill meds - methylphenidate (RITALIN) 20 MG tablet; Take 1 tablet (20 mg total) by mouth 2 (two) times daily.  Dispense: 60 tablet; Refill: 0 - methylphenidate (RITALIN) 20 MG tablet; Take 1 tablet (20 mg total) by mouth 2 (two) times daily.  Dispense: 60 tablet; Refill:  0 - methylphenidate (RITALIN) 20 MG tablet; Take 1 tablet (20 mg total) by mouth 2 (two) times daily.  Dispense: 60 tablet; Refill: 0

## 2019-01-18 NOTE — Telephone Encounter (Signed)
Sent to pharmacy 

## 2019-01-18 NOTE — Telephone Encounter (Signed)
Please advise- sig is listed as take as directed?

## 2019-01-18 NOTE — Patient Instructions (Signed)

## 2019-01-18 NOTE — Telephone Encounter (Signed)
Medication Refill - Medication: valACYclovir (VALTREX) 1000 MG tablet  Pharmacy stated they need clarification on directions for valtrex. Requesting callback.  Has the patient contacted their pharmacy? Yes.   (Agent: If no, request that the patient contact the pharmacy for the refill.) (Agent: If yes, when and what did the pharmacy advise?)  Preferred Pharmacy (with phone number or street name):  [facility] # [id number] [place] [phone] (Phone) [phone] (Fax)     Agent: Please be advised that RX refills may take up to 3 business days. We ask that you follow-up with your pharmacy.

## 2019-01-23 LAB — CYTOLOGY - PAP
Chlamydia: NEGATIVE
Diagnosis: NEGATIVE
Neisseria Gonorrhea: NEGATIVE
Trichomonas: NEGATIVE

## 2019-01-23 LAB — CERVICOVAGINAL ANCILLARY ONLY: Herpes: NEGATIVE

## 2019-02-28 ENCOUNTER — Other Ambulatory Visit: Payer: Self-pay | Admitting: Family Medicine

## 2019-02-28 DIAGNOSIS — Z789 Other specified health status: Secondary | ICD-10-CM

## 2019-06-07 ENCOUNTER — Other Ambulatory Visit: Payer: Self-pay | Admitting: Family Medicine

## 2019-06-07 ENCOUNTER — Telehealth: Payer: Self-pay | Admitting: Family Medicine

## 2019-06-07 DIAGNOSIS — Z789 Other specified health status: Secondary | ICD-10-CM

## 2019-06-07 MED ORDER — MONTELUKAST SODIUM 10 MG PO TABS: 10.0000 mg | ORAL_TABLET | Freq: Every day | ORAL | 0 refills | Status: DC

## 2019-06-07 NOTE — Telephone Encounter (Unsigned)
Copied from [place] [id number]. Topic: Quick Communication - Rx Refill/Question >> Jun 07, 2019 12:44 PM Mcneil, Ja-Kwan wrote: Medication: methylphenidate (RITALIN) 20 MG tablet  Has the patient contacted their pharmacy? yes   Preferred Pharmacy (with phone number or street name): [facility] # [place], [place] Phone: [phone]  Fax: [phone]  Agent: Please be advised that RX refills may take up to 3 business days. We ask that you follow-up with your pharmacy.

## 2019-06-11 ENCOUNTER — Other Ambulatory Visit: Payer: Self-pay | Admitting: Family Medicine

## 2019-06-11 DIAGNOSIS — F988 Other specified behavioral and emotional disorders with onset usually occurring in childhood and adolescence: Secondary | ICD-10-CM

## 2019-06-11 MED ORDER — METHYLPHENIDATE HCL 20 MG PO TABS: 20.0000 mg | ORAL_TABLET | Freq: Two times a day (BID) | ORAL | 0 refills | Status: DC

## 2019-06-11 NOTE — Telephone Encounter (Signed)
Refill sent but she will need f/u --- I think  feb ?

## 2019-06-11 NOTE — Telephone Encounter (Signed)
Requesting:Ritalin  Contract:07/26/2018 UDS: 07/13/2018 Last Visit:01/18/2019 Next Visit:01/21/2020 Last Refill:01/18/2019 3 rxs  Please Advise

## 2019-06-12 NOTE — Telephone Encounter (Signed)
Sent patient mychart message

## 2019-10-26 ENCOUNTER — Other Ambulatory Visit: Payer: Self-pay | Admitting: Family Medicine

## 2019-10-26 DIAGNOSIS — Z789 Other specified health status: Secondary | ICD-10-CM

## 2019-11-27 ENCOUNTER — Ambulatory Visit: Payer: Self-pay

## 2019-11-27 ENCOUNTER — Ambulatory Visit: Payer: Self-pay | Admitting: Family Medicine

## 2019-11-27 ENCOUNTER — Other Ambulatory Visit: Payer: Self-pay

## 2019-11-27 ENCOUNTER — Encounter: Payer: Self-pay | Admitting: Family Medicine

## 2019-11-27 VITALS — BP 128/86 | HR 104 | Ht 63.0 in | Wt 186.0 lb

## 2019-11-27 DIAGNOSIS — M25532 Pain in left wrist: Secondary | ICD-10-CM

## 2019-11-27 DIAGNOSIS — G5602 Carpal tunnel syndrome, left upper limb: Secondary | ICD-10-CM | POA: Insufficient documentation

## 2019-11-27 MED ORDER — GABAPENTIN 300 MG PO CAPS: 300.0000 mg | ORAL_CAPSULE | Freq: Every day | ORAL | 0 refills | Status: DC

## 2019-11-27 MED ORDER — MELOXICAM 15 MG PO TABS: 15.0000 mg | ORAL_TABLET | Freq: Every day | ORAL | 0 refills | Status: DC

## 2019-11-27 NOTE — Assessment & Plan Note (Signed)
Injection given today.  I do believe that actually patient could have a potential for improvement with surgery if necessary.  Discussed bracing, home exercise, which activities to do which wants to avoid.  Patient is to increase activity slowly.  We discussed the gabapentin as well as the meloxicam and topical anti-inflammatories.  Follow-up with me again 4 to 8 weeks

## 2019-11-27 NOTE — Progress Notes (Signed)
4

## 2019-11-27 NOTE — Patient Instructions (Addendum)
Brace day and night for 2 weeks then just at night for 2 weeks Exercises 3x a week Pennsaid 2x daily, fingertip sized amount See me again in 5- 6 weeks

## 2019-11-27 NOTE — Progress Notes (Signed)
[name] Phone: [phone] Subjective:    I'm seeing this patient by the request  of:  Ann Held, DO  CC: Left  wrist pain  ACZ:[id number]  Cindy Stephens is a 54 y.o. female coming in with complaint of left wrist pain. Last seen on 2018 for left knee pain. Fracture in left wrist 2019. Was misdiagnosed as a non-displaced fracture but was told a week later that she had a displaced fracture. Had to have wrist re-broken and set by Dr. Gerarda Fraction. Developed carpal tunnel immediately following surgery. Had cortisone injection Jan 2020 that lasted until Oct 2020. Saw Dr. Gerarda Fraction this year. States physician hit the nerve in injection given this year. Pain has worsened since then. Uses lidocaine cream and bracing for pain relief. States that she has "attacks" throughout the day where the "nerve is spasming."      Past Medical History:  Diagnosis Date  . Allergic rhinitis   . Depression   . GERD (gastroesophageal reflux disease)   . Shoulder dislocation 2007   Left Shoulder  . Urinary incontinence    Past Surgical History:  Procedure Laterality Date  . APPENDECTOMY    . ELBOW SURGERY Left 2007   elbow was shattered   Social History   Socioeconomic History  . Marital status: Married    Spouse name: Not on file  . Number of children: Not on file  . Years of education: Not on file  . Highest education level: Not on file  Occupational History  . Not on file  Tobacco Use  . Smoking status: Never Smoker  . Smokeless tobacco: Never Used  Substance and Sexual Activity  . Alcohol use: Yes    Alcohol/week: 0.0 standard drinks    Comment: Occ  . Drug use: No  . Sexual activity: Yes    Partners: Male  Other Topics Concern  . Not on file  Social History Narrative   Lives with husband and children in a 2 story home.  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  Education: BS   Social Determinants of Health    Financial Resource Strain:   . Difficulty of Paying Living Expenses:   Food Insecurity:   . Worried About Charity fundraiser in the Last Year:   . Arboriculturist in the Last Year:   Transportation Needs:   . Film/video editor (Medical):   Marland Kitchen Lack of Transportation (Non-Medical):   Physical Activity:   . Days of Exercise per Week:   . Minutes of Exercise per Session:   Stress:   . Feeling of Stress :   Social Connections:   . Frequency of Communication with Friends and Family:   . Frequency of Social Gatherings with Friends and Family:   . Attends Religious Services:   . Active Member of Clubs or Organizations:   . Attends Archivist Meetings:   Marland Kitchen Marital Status:    Allergies  Allergen Reactions  . Sulfa Antibiotics Hives and Nausea Only   Family History  Problem Relation Age of Onset  . Arthritis Father   . Hyperlipidemia Father   . Alzheimer's disease Father   . Hypertension Father        Paternal Family  . Arthritis Mother   . Arthritis Other   . Osteoarthritis Brother   . Arthritis Sister   . Diabetes Paternal Grandmother        PGGM  .  Heart disease Other        Pateternal Family  . Stroke Other        Paternal Family      Current Outpatient Medications (Respiratory):  .  fluticasone (FLONASE) 50 MCG/ACT nasal spray, Place 2 sprays into both nostrils daily. Marland Kitchen  loratadine (CLARITIN) 10 MG tablet, Take 10 mg by mouth daily. .  montelukast (SINGULAIR) 10 MG tablet, TAKE ONE TABLET BY MOUTH DAILY AT BEDTIME  .  pseudoephedrine (SUDAFED) 30 MG tablet, Take 30 mg by mouth every 4 (four) hours as needed for congestion.  Current Outpatient Medications (Analgesics):  .  aspirin 81 MG tablet, Take 81 mg by mouth daily. .  meloxicam (MOBIC) 15 MG tablet, Take 1 tablet (15 mg total) by mouth daily. .  meloxicam (MOBIC) 15 MG tablet, Take 1 tablet (15 mg total) by mouth daily.  Current Outpatient Medications (Hematological):  .  vitamin B-12  (CYANOCOBALAMIN) 1000 MCG tablet, Take 1,000 mcg by mouth daily.  Current Outpatient Medications (Other):  Marland Kitchen  buPROPion (WELLBUTRIN XL) 300 MG 24 hr tablet, Take 1 tablet (300 mg total) by mouth daily. Marland Kitchen  co-enzyme Q-10 30 MG capsule, Take 30 mg by mouth 3 (three) times daily. .  Collagen Hydrolysate, Bovine, POWD, by Does not apply route. Marland Kitchen  DHEA 10 MG TABS, Take by mouth. .  diazepam (VALIUM) 5 MG tablet, Take 1 tablet (5 mg total) by mouth 3 (three) times daily as needed for anxiety. Marland Kitchen  glucosamine-chondroitin 500-400 MG tablet, Take 1 tablet by mouth 2 (two) times daily. Marland Kitchen  lidocaine (XYLOCAINE) 5 % ointment, Apply 1 application topically 3 (three) times daily as needed. Use gloves when applying to left leg. .  magnesium oxide (MAG-OX) 400 MG tablet, Take 400 mg by mouth daily. Carlene Coria Mushroom POWD, by Does not apply route. .  methylphenidate (RITALIN) 20 MG tablet, Take 1 tablet (20 mg total) by mouth 2 (two) times daily. .  methylphenidate (RITALIN) 20 MG tablet, Take 1 tablet (20 mg total) by mouth 2 (two) times daily. .  methylphenidate (RITALIN) 20 MG tablet, Take 1 tablet (20 mg total) by mouth 2 (two) times daily. .  Misc Natural Products (PROGESTERONE EX), Apply topically daily. Marland Kitchen  NIACIN CR PO, Take by mouth. .  NONFORMULARY OR COMPOUNDED ITEM, Lipid, hep-- hyperlipidemia .  NONFORMULARY OR COMPOUNDED ITEM, Cbcd, cmp, lipid, tsh----- dx complete physical exam .  NONFORMULARY OR COMPOUNDED ITEM, Free t3 Free t4 Dx preventative health exam .  Omega-3 Fatty Acids (FISH OIL) 1000 MG CAPS, Take 1 capsule by mouth. Marland Kitchen  omeprazole (PRILOSEC OTC) 20 MG tablet, Take 20 mg by mouth daily. .  Potassium 99 MG TABS, Take by mouth. .  Probiotic Product (PROBIOTIC DAILY PO), Take by mouth. .  tazarotene (TAZORAC) 0.05 % cream, Apply topically at bedtime. .  Turmeric 500 MG CAPS, Take by mouth. .  valACYclovir (VALTREX) 1000 MG tablet, 1 po qd .  zolpidem (AMBIEN) 5 MG tablet, Take 1  tablet (5 mg total) by mouth at bedtime as needed for sleep. .  Diclofenac Sodium 2 % SOLN, Apply twice daily. Marland Kitchen  gabapentin (NEURONTIN) 300 MG capsule, Take 1 capsule (300 mg total) by mouth at bedtime.   Reviewed prior external information including notes and imaging from  primary care provider As well as notes that were available from care everywhere and other healthcare systems.  Past medical history, social, surgical and family history all reviewed in electronic medical record.  No pertanent information unless stated regarding to the chief complaint.   Review of Systems:  No headache, visual changes, nausea, vomiting, diarrhea, constipation, dizziness, abdominal pain, skin rash, fevers, chills, night sweats, weight loss, swollen lymph nodes, body aches, joint swelling, chest pain, shortness of breath, mood changes. POSITIVE muscle aches  Objective  Blood pressure 128/86, pulse (!) 104, height 5\' 3"  (1.6 m), weight 186 lb (84.4 kg), last menstrual period 06/13/2016, SpO2 97 %.   General: No apparent distress alert and oriented x3 mood and affect normal, dressed appropriately.  HEENT: Pupils equal, extraocular movements intact  Respiratory: Patient's speak in full sentences and does not appear short of breath  Cardiovascular: No lower extremity edema, non tender, no erythema  Neuro: Cranial nerves II through XII are intact, neurovascularly intact in all extremities with 2+ DTRs and 2+ pulses.  Gait normal with good balance and coordination.  MSK:   Left wrist does have unfortunately some abnormal variation especially on the ulnar aspect of the wrist.  Patient does have good grip strength but does have some mild thenar eminence wasting compared to the contralateral side.  Positive Tinel noted.  Neurovascularly intact distally  Limited musculoskeletal ultrasound was performed and interpreted by 06/15/2016  Limited ultrasound of patient's left wrist shows that there is severe dilation  of an hypoechoic changes of the median nerve noted.  With compression patient's symptoms do increase.  Patient's bone appears to be fairly well healed at the moment.  Procedure: Real-time Ultrasound Guided Injection of left carpal tunnel Device: GE Logiq Q7 Ultrasound guided injection is preferred based studies that show increased duration, increased effect, greater accuracy, decreased procedural pain, increased response rate with ultrasound guided versus blind injection.  Verbal informed consent obtained.  Time-out conducted.  Noted no overlying erythema, induration, or other signs of local infection.  Skin prepped in a sterile fashion.  Local anesthesia: Topical Ethyl chloride.  With sterile technique and under real time ultrasound guidance:  median nerve visualized.  23g 5/8 inch needle inserted distal to proximal approach into nerve sheath. Pictures taken nfor needle placement. Patient did have injection of 2 cc of 0.5% Marcaine, and 1 cc of Kenalog 40 mg/dL. Completed without difficulty  Pain immediately resolved suggesting accurate placement of the medication.  Advised to call if fevers/chills, erythema, induration, drainage, or persistent bleeding.  Images permanently stored and available for review in the ultrasound unit.  Impression: Technically successful ultrasound guided injection.    Impression and Recommendations:     The above documentation has been reviewed and is accurate and complete 7/8, DO       Note: This dictation was prepared with Dragon dictation along with smaller phrase technology. Any transcriptional errors that result from this process are unintentional.

## 2020-01-08 ENCOUNTER — Ambulatory Visit: Payer: Self-pay | Admitting: Family Medicine

## 2020-01-08 NOTE — Progress Notes (Deleted)
[name] [address] [place] [address] Phone: [phone] Subjective:    I'm seeing this patient by the request  of:  Donato Schultz, DO  CC:   [id number]   11/27/2019 Injection given today.  I do believe that actually patient could have a potential for improvement with surgery if necessary.  Discussed bracing, home exercise, which activities to do which wants to avoid.  Patient is to increase activity slowly.  We discussed the gabapentin as well as the meloxicam and topical anti-inflammatories.  Follow-up with me again 4 to 8 weeks'  Update 01/08/2020 Cindy Stephens is a 54 y.o. female coming in with complaint of left wrist pain. Patient states       Past Medical History:  Diagnosis Date  . Allergic rhinitis   . Depression   . GERD (gastroesophageal reflux disease)   . Shoulder dislocation 2007   Left Shoulder  . Urinary incontinence    Past Surgical History:  Procedure Laterality Date  . APPENDECTOMY    . ELBOW SURGERY Left 2007   elbow was shattered   Social History   Socioeconomic History  . Marital status: Married    Spouse name: Not on file  . Number of children: Not on file  . Years of education: Not on file  . Highest education level: Not on file  Occupational History  . Not on file  Tobacco Use  . Smoking status: Never Smoker  . Smokeless tobacco: Never Used  Substance and Sexual Activity  . Alcohol use: Yes    Alcohol/week: 0.0 standard drinks    Comment: Occ  . Drug use: No  . Sexual activity: Yes    Partners: Male  Other Topics Concern  . Not on file  Social History Narrative   Lives with husband and children in a 2 story home.  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  Education: BS   Social Determinants of Health   Financial Resource Strain:   . Difficulty of Paying Living Expenses:   Food Insecurity:   . Worried About Programme researcher, broadcasting/film/video in the Last Year:   . Barista in the Last Year:    Transportation Needs:   . Freight forwarder (Medical):   Marland Kitchen Lack of Transportation (Non-Medical):   Physical Activity:   . Days of Exercise per Week:   . Minutes of Exercise per Session:   Stress:   . Feeling of Stress :   Social Connections:   . Frequency of Communication with Friends and Family:   . Frequency of Social Gatherings with Friends and Family:   . Attends Religious Services:   . Active Member of Clubs or Organizations:   . Attends Banker Meetings:   Marland Kitchen Marital Status:    Allergies  Allergen Reactions  . Sulfa Antibiotics Hives and Nausea Only   Family History  Problem Relation Age of Onset  . Arthritis Father   . Hyperlipidemia Father   . Alzheimer's disease Father   . Hypertension Father        Paternal Family  . Arthritis Mother   . Arthritis Other   . Osteoarthritis Brother   . Arthritis Sister   . Diabetes Paternal Grandmother        PGGM  . Heart disease Other        Pateternal Family  . Stroke Other        Paternal Family  Current Outpatient Medications (Respiratory):  .  fluticasone (FLONASE) 50 MCG/ACT nasal spray, Place 2 sprays into both nostrils daily. Marland Kitchen  loratadine (CLARITIN) 10 MG tablet, Take 10 mg by mouth daily. .  montelukast (SINGULAIR) 10 MG tablet, TAKE ONE TABLET BY MOUTH DAILY AT BEDTIME  .  pseudoephedrine (SUDAFED) 30 MG tablet, Take 30 mg by mouth every 4 (four) hours as needed for congestion.  Current Outpatient Medications (Analgesics):  .  aspirin 81 MG tablet, Take 81 mg by mouth daily. .  meloxicam (MOBIC) 15 MG tablet, Take 1 tablet (15 mg total) by mouth daily. .  meloxicam (MOBIC) 15 MG tablet, Take 1 tablet (15 mg total) by mouth daily.  Current Outpatient Medications (Hematological):  .  vitamin B-12 (CYANOCOBALAMIN) 1000 MCG tablet, Take 1,000 mcg by mouth daily.  Current Outpatient Medications (Other):  Marland Kitchen  buPROPion (WELLBUTRIN XL) 300 MG 24 hr tablet, Take 1 tablet (300 mg total) by mouth  daily. Marland Kitchen  co-enzyme Q-10 30 MG capsule, Take 30 mg by mouth 3 (three) times daily. .  Collagen Hydrolysate, Bovine, POWD, by Does not apply route. Marland Kitchen  DHEA 10 MG TABS, Take by mouth. .  diazepam (VALIUM) 5 MG tablet, Take 1 tablet (5 mg total) by mouth 3 (three) times daily as needed for anxiety. .  Diclofenac Sodium 2 % SOLN, Apply twice daily. Marland Kitchen  gabapentin (NEURONTIN) 300 MG capsule, Take 1 capsule (300 mg total) by mouth at bedtime. Marland Kitchen  glucosamine-chondroitin 500-400 MG tablet, Take 1 tablet by mouth 2 (two) times daily. Marland Kitchen  lidocaine (XYLOCAINE) 5 % ointment, Apply 1 application topically 3 (three) times daily as needed. Use gloves when applying to left leg. .  magnesium oxide (MAG-OX) 400 MG tablet, Take 400 mg by mouth daily. Carlene Coria Mushroom POWD, by Does not apply route. .  methylphenidate (RITALIN) 20 MG tablet, Take 1 tablet (20 mg total) by mouth 2 (two) times daily. .  methylphenidate (RITALIN) 20 MG tablet, Take 1 tablet (20 mg total) by mouth 2 (two) times daily. .  methylphenidate (RITALIN) 20 MG tablet, Take 1 tablet (20 mg total) by mouth 2 (two) times daily. .  Misc Natural Products (PROGESTERONE EX), Apply topically daily. Marland Kitchen  NIACIN CR PO, Take by mouth. .  NONFORMULARY OR COMPOUNDED ITEM, Lipid, hep-- hyperlipidemia .  NONFORMULARY OR COMPOUNDED ITEM, Cbcd, cmp, lipid, tsh----- dx complete physical exam .  NONFORMULARY OR COMPOUNDED ITEM, Free t3 Free t4 Dx preventative health exam .  Omega-3 Fatty Acids (FISH OIL) 1000 MG CAPS, Take 1 capsule by mouth. Marland Kitchen  omeprazole (PRILOSEC OTC) 20 MG tablet, Take 20 mg by mouth daily. .  Potassium 99 MG TABS, Take by mouth. .  Probiotic Product (PROBIOTIC DAILY PO), Take by mouth. .  tazarotene (TAZORAC) 0.05 % cream, Apply topically at bedtime. .  Turmeric 500 MG CAPS, Take by mouth. .  valACYclovir (VALTREX) 1000 MG tablet, 1 po qd .  zolpidem (AMBIEN) 5 MG tablet, Take 1 tablet (5 mg total) by mouth at bedtime as needed for  sleep.   Reviewed prior external information including notes and imaging from  primary care provider As well as notes that were available from care everywhere and other healthcare systems.  Past medical history, social, surgical and family history all reviewed in electronic medical record.  No pertanent information unless stated regarding to the chief complaint.   Review of Systems:  No headache, visual changes, nausea, vomiting, diarrhea, constipation, dizziness, abdominal pain, skin rash,  fevers, chills, night sweats, weight loss, swollen lymph nodes, body aches, joint swelling, chest pain, shortness of breath, mood changes. POSITIVE muscle aches  Objective  Last menstrual period 06/13/2016.   General: No apparent distress alert and oriented x3 mood and affect normal, dressed appropriately.  HEENT: Pupils equal, extraocular movements intact  Respiratory: Patient's speak in full sentences and does not appear short of breath  Cardiovascular: No lower extremity edema, non tender, no erythema  Neuro: Cranial nerves II through XII are intact, neurovascularly intact in all extremities with 2+ DTRs and 2+ pulses.  Gait normal with good balance and coordination.  MSK:  Non tender with full range of motion and good stability and symmetric strength and tone of shoulders, elbows, wrist, hip, knee and ankles bilaterally.     Impression and Recommendations:     The above documentation has been reviewed and is accurate and complete Wilford Grist       Note: This dictation was prepared with Dragon dictation along with smaller phrase technology. Any transcriptional errors that result from this process are unintentional.

## 2020-01-21 ENCOUNTER — Encounter: Payer: Self-pay | Admitting: Family Medicine

## 2020-03-03 ENCOUNTER — Telehealth: Payer: Self-pay | Admitting: Family Medicine

## 2020-03-03 ENCOUNTER — Other Ambulatory Visit: Payer: Self-pay

## 2020-03-03 MED ORDER — MELOXICAM 15 MG PO TABS: 15.0000 mg | ORAL_TABLET | Freq: Every day | ORAL | 0 refills | Status: DC

## 2020-03-03 MED ORDER — GABAPENTIN 300 MG PO CAPS: 300.0000 mg | ORAL_CAPSULE | Freq: Every day | ORAL | 0 refills | Status: DC

## 2020-03-03 NOTE — Telephone Encounter (Signed)
Rx refilled. Patient notified.  

## 2020-03-03 NOTE — Telephone Encounter (Signed)
Patient called requesting a refill on gabapentin (NEURONTIN) 300 MG capsule and meloxicam (MOBIC) 15 MG tablet to be sent to [facility] in [facility]. Please advise.

## 2020-05-12 ENCOUNTER — Encounter: Payer: Self-pay | Admitting: Internal Medicine

## 2020-05-12 ENCOUNTER — Other Ambulatory Visit: Payer: Self-pay

## 2020-05-12 ENCOUNTER — Telehealth (INDEPENDENT_AMBULATORY_CARE_PROVIDER_SITE_OTHER): Payer: Self-pay | Admitting: Internal Medicine

## 2020-05-12 ENCOUNTER — Encounter (INDEPENDENT_AMBULATORY_CARE_PROVIDER_SITE_OTHER): Payer: Self-pay

## 2020-05-12 DIAGNOSIS — B349 Viral infection, unspecified: Secondary | ICD-10-CM

## 2020-05-12 MED ORDER — ALBUTEROL SULFATE HFA 108 (90 BASE) MCG/ACT IN AERS: 2.0000 | INHALATION_SPRAY | Freq: Four times a day (QID) | RESPIRATORY_TRACT | 0 refills | Status: DC | PRN

## 2020-05-12 MED ORDER — AZITHROMYCIN 250 MG PO TABS: ORAL_TABLET | ORAL | 0 refills | Status: DC

## 2020-05-12 NOTE — Progress Notes (Signed)
Subjective:    Patient ID: Cindy Stephens, female    DOB: 1966/05/30, 54 y.o.   MRN: [id number]  DOS:  05/12/2020 Type of visit - description: Virtual Visit via Video Note  I connected with the above patient  by a video enabled telemedicine application and verified that I am speaking with the correct person using two identifiers.   THIS ENCOUNTER IS A VIRTUAL VISIT DUE TO COVID-19 - PATIENT WAS NOT SEEN IN THE OFFICE. PATIENT HAS CONSENTED TO VIRTUAL VISIT / TELEMEDICINE VISIT   Location of patient: home  Location of provider: office  Persons participating in the virtual visit: patient, provider   I discussed the limitations of evaluation and management by telemedicine and the availability of in person appointments. The patient expressed understanding and agreed to proceed.  Acute Symptoms started 2 weeks ago: Cough, sinus congestion, chest congestion, some wheezing.  Mild difficulty breathing.  She had a rapid Covid test on 05/01/2020: Negative. Since then is feeling about the same: Continue with cough, had fever up to 101.5 the first week, no fever in 5 days Denies chest pain per se but admits to some wheezing and chest tightness. Occasionally feels short of breath Denies rash, no myalgias or arthralgias. + Fatigue. Mild headache on and off.    Review of Systems See above   Past Medical History:  Diagnosis Date  . Allergic rhinitis   . Depression   . GERD (gastroesophageal reflux disease)   . Shoulder dislocation 2007   Left Shoulder  . Urinary incontinence     Past Surgical History:  Procedure Laterality Date  . APPENDECTOMY    . ELBOW SURGERY Left 2007   elbow was shattered    Allergies as of 05/12/2020      Reactions   Sulfa Antibiotics Hives, Nausea Only      Medication List       Accurate as of May 12, 2020  3:36 PM. If you have any questions, ask your nurse or doctor.        aspirin 81 MG tablet Take 81 mg by mouth daily.   buPROPion  300 MG 24 hr tablet Commonly known as: Wellbutrin XL Take 1 tablet (300 mg total) by mouth daily.   co-enzyme Q-10 30 MG capsule Take 30 mg by mouth 3 (three) times daily.   Collagen Hydrolysate (Bovine) Powd by Does not apply route.   DHEA 10 MG Tabs Take by mouth.   diazepam 5 MG tablet Commonly known as: VALIUM Take 1 tablet (5 mg total) by mouth 3 (three) times daily as needed for anxiety.   Diclofenac Sodium 2 % Soln Apply twice daily.   Fish Oil 1000 MG Caps Take 1 capsule by mouth.   fluticasone 50 MCG/ACT nasal spray Commonly known as: Flonase Place 2 sprays into both nostrils daily.   gabapentin 300 MG capsule Commonly known as: NEURONTIN Take 1 capsule (300 mg total) by mouth at bedtime.   glucosamine-chondroitin 500-400 MG tablet Take 1 tablet by mouth 2 (two) times daily.   lidocaine 5 % ointment Commonly known as: XYLOCAINE Apply 1 application topically 3 (three) times daily as needed. Use gloves when applying to left leg.   loratadine 10 MG tablet Commonly known as: CLARITIN Take 10 mg by mouth daily.   magnesium oxide 400 MG tablet Commonly known as: MAG-OX Take 400 mg by mouth daily.   [place] Mushroom Powd by Does not apply route.   meloxicam 15 MG tablet Commonly known as:  MOBIC Take 1 tablet (15 mg total) by mouth daily.   methylphenidate 20 MG tablet Commonly known as: Ritalin Take 1 tablet (20 mg total) by mouth 2 (two) times daily.   methylphenidate 20 MG tablet Commonly known as: Ritalin Take 1 tablet (20 mg total) by mouth 2 (two) times daily.   methylphenidate 20 MG tablet Commonly known as: Ritalin Take 1 tablet (20 mg total) by mouth 2 (two) times daily.   montelukast 10 MG tablet Commonly known as: SINGULAIR TAKE ONE TABLET BY MOUTH DAILY AT BEDTIME   NIACIN CR PO Take by mouth.   NONFORMULARY OR COMPOUNDED ITEM Lipid, hep-- hyperlipidemia   NONFORMULARY OR COMPOUNDED ITEM Cbcd, cmp, lipid, tsh----- dx complete  physical exam   NONFORMULARY OR COMPOUNDED ITEM Free t3 Free t4 Dx preventative health exam   omeprazole 20 MG tablet Commonly known as: PRILOSEC OTC Take 20 mg by mouth daily.   Potassium 99 MG Tabs Take by mouth.   PROBIOTIC DAILY PO Take by mouth.   PROGESTERONE EX Apply topically daily.   pseudoephedrine 30 MG tablet Commonly known as: SUDAFED Take 30 mg by mouth every 4 (four) hours as needed for congestion.   tazarotene 0.05 % cream Commonly known as: Tazorac Apply topically at bedtime.   Turmeric 500 MG Caps Take by mouth.   valACYclovir 1000 MG tablet Commonly known as: VALTREX 1 po qd   vitamin B-12 1000 MCG tablet Commonly known as: CYANOCOBALAMIN Take 1,000 mcg by mouth daily.   zolpidem 5 MG tablet Commonly known as: AMBIEN Take 1 tablet (5 mg total) by mouth at bedtime as needed for sleep.          Objective:   Physical Exam LMP 06/13/2016  This is a virtual video visit, no vital signs available, she is alert oriented x3, speaking in complete sentences, not toxic appearing, no distress.  She does feel tired.  No apparent wheezing or chest congestion.    Assessment    54 year old female, presents with:  Viral syndrome: Symptoms started 2 weeks ago, had a rapid test for Covid -4 days after the onset of symptoms. She had initially low-grade fever but now is afebrile. Continue with sinus and chest congestion, some wheezing.  She is not a smoker, denies a history of asthma although on the problem list I see" asthma due to environmental allergies". Plan: Recommend a chest x-ray, the patient is self-pay, declines, understand that a chest x-ray will help me guide her treatment. We will treat empirically with Zithromax Check Covid test PCR and let me know the results. Albuterol as needed for wheezing Antihistaminics: Okay to continue Claritin or Zyrtec, not both. Okay to take a small amount of Sudafed, no history of HTN Continue Flonase Mucinex  DM Rest, fluids, Tylenol Call if not better in few days, ER if severe symptoms Detailed instructions sent   I discussed the assessment and treatment plan with the patient. The patient was provided an opportunity to ask questions and all were answered. The patient agreed with the plan and demonstrated an understanding of the instructions.   The patient was advised to call back or seek an in-person evaluation if the symptoms worsen or if the condition fails to improve as anticipated.

## 2020-05-30 ENCOUNTER — Telehealth: Payer: Self-pay | Admitting: Family Medicine

## 2020-05-30 NOTE — Telephone Encounter (Signed)
Patient called requesting a refill on her gabapentin (NEURONTIN) 300 MG capsule and meloxicam (MOBIC) 15 MG tablet to be sent to [facility] on [address] in [place]. She said that they are leaving to go out of town early Thursday morning and she will run out while they are gone so she would like to pick it up by Wednesday if possible.  Please advise.

## 2020-06-02 ENCOUNTER — Other Ambulatory Visit: Payer: Self-pay

## 2020-06-02 MED ORDER — GABAPENTIN 300 MG PO CAPS: 300.0000 mg | ORAL_CAPSULE | Freq: Every day | ORAL | 0 refills | Status: DC

## 2020-06-02 MED ORDER — MELOXICAM 15 MG PO TABS: 15.0000 mg | ORAL_TABLET | Freq: Every day | ORAL | 0 refills | Status: DC

## 2020-06-02 NOTE — Telephone Encounter (Signed)
Rx filled and patient sent MyChart message.

## 2020-09-03 ENCOUNTER — Telehealth: Payer: Self-pay | Admitting: Family Medicine

## 2020-09-03 NOTE — Telephone Encounter (Signed)
Spoke with patient. Per a verbal from Dr. Katrinka Blazing he would like for patient to get CBC and CMET labs prior to refills. Patient is uninsured and would like to look into cost. Will get back with patient regarding information from our lab.

## 2020-09-03 NOTE — Telephone Encounter (Signed)
Patient called requesting a refill on gabapentin (NEURONTIN) 300 MG capsule and meloxicam (MOBIC) 15 MG tablet sent to [facility] in [facility].  Please advise.

## 2020-09-04 ENCOUNTER — Other Ambulatory Visit: Payer: Self-pay

## 2020-09-04 DIAGNOSIS — M2550 Pain in unspecified joint: Secondary | ICD-10-CM

## 2020-09-04 NOTE — Telephone Encounter (Signed)
Spoke with patient. Per Cindy Stephens, lab work should cost patient around $25. Patient would like to proceed with labwork at our office and will come in at her convenience, M-F, 8a-4p.

## 2020-09-05 ENCOUNTER — Other Ambulatory Visit (INDEPENDENT_AMBULATORY_CARE_PROVIDER_SITE_OTHER): Payer: Self-pay

## 2020-09-05 ENCOUNTER — Other Ambulatory Visit: Payer: Self-pay

## 2020-09-05 DIAGNOSIS — M2550 Pain in unspecified joint: Secondary | ICD-10-CM

## 2020-09-05 LAB — COMPREHENSIVE METABOLIC PANEL
ALT: 30 U/L (ref 0–35)
AST: 20 U/L (ref 0–37)
Albumin: 4.5 g/dL (ref 3.5–5.2)
Alkaline Phosphatase: 78 U/L (ref 39–117)
BUN: 17 mg/dL (ref 6–23)
CO2: 31 mEq/L (ref 19–32)
Calcium: 10 mg/dL (ref 8.4–10.5)
Chloride: 102 mEq/L (ref 96–112)
Creatinine, Ser: 0.7 mg/dL (ref 0.40–1.20)
GFR: 97.17 mL/min (ref >60.00)
Glucose, Bld: 94 mg/dL (ref 70–99)
Potassium: 4.4 mEq/L (ref 3.5–5.1)
Sodium: 139 mEq/L (ref 135–145)
Total Bilirubin: 0.4 mg/dL (ref 0.2–1.2)
Total Protein: 7.5 g/dL (ref 6.0–8.3)

## 2020-09-05 LAB — CBC WITH DIFFERENTIAL/PLATELET
Basophils Absolute: 0 10*3/uL (ref 0.0–0.1)
Basophils Relative: 0.7 % (ref 0.0–3.0)
Eosinophils Absolute: 0.1 10*3/uL (ref 0.0–0.7)
Eosinophils Relative: 1.7 % (ref 0.0–5.0)
HCT: 39.5 % (ref 36.0–46.0)
Hemoglobin: 13.8 g/dL (ref 12.0–15.0)
Lymphocytes Relative: 38.9 % (ref 12.0–46.0)
Lymphs Abs: 2 10*3/uL (ref 0.7–4.0)
MCHC: 34.9 g/dL (ref 30.0–36.0)
MCV: 85.9 fl (ref 78.0–100.0)
Monocytes Absolute: 0.4 10*3/uL (ref 0.1–1.0)
Monocytes Relative: 7.4 % (ref 3.0–12.0)
Neutro Abs: 2.6 10*3/uL (ref 1.4–7.7)
Neutrophils Relative %: 51.3 % (ref 43.0–77.0)
Platelets: 204 10*3/uL (ref 150.0–400.0)
RBC: 4.6 Mil/uL (ref 3.87–5.11)
RDW: 12.7 % (ref 11.5–15.5)
WBC: 5.1 10*3/uL (ref 4.0–10.5)

## 2020-09-10 ENCOUNTER — Other Ambulatory Visit: Payer: Self-pay | Admitting: Family Medicine

## 2020-09-10 MED ORDER — GABAPENTIN 300 MG PO CAPS: 300.0000 mg | ORAL_CAPSULE | Freq: Every day | ORAL | 0 refills | Status: DC

## 2020-09-10 MED ORDER — MELOXICAM 15 MG PO TABS: 15.0000 mg | ORAL_TABLET | Freq: Every day | ORAL | 0 refills | Status: DC

## 2020-09-17 NOTE — Progress Notes (Deleted)
[facility] [address] [place] [address] Phone: [phone] Subjective:    I'm seeing this patient by the request  of:  Donato Schultz, DO  CC:   [id number]   11/27/2019 Injection given today.  I do believe that actually patient could have a potential for improvement with surgery if necessary.  Discussed bracing, home exercise, which activities to do which wants to avoid.  Patient is to increase activity slowly.  We discussed the gabapentin as well as the meloxicam and topical anti-inflammatories.  Follow-up with me again 4 to 8 weeks  Update 09/18/2020 Cindy Stephens is a 55 y.o. female coming in with complaint of left wrist pain. Patient requested refill of meloxicam and gabapentin and had labwork performed since she had not been seen in 10 months. Patient states   Onset-  Location Duration-  Character- Aggravating factors- Reliving factors-  Therapies tried-  Severity-     Past Medical History:  Diagnosis Date  . Allergic rhinitis   . Depression   . GERD (gastroesophageal reflux disease)   . Shoulder dislocation 2007   Left Shoulder  . Urinary incontinence    Past Surgical History:  Procedure Laterality Date  . APPENDECTOMY    . ELBOW SURGERY Left 2007   elbow was shattered   Social History   Socioeconomic History  . Marital status: Married    Spouse name: Not on file  . Number of children: Not on file  . Years of education: Not on file  . Highest education level: Not on file  Occupational History  . Not on file  Tobacco Use  . Smoking status: Never Smoker  . Smokeless tobacco: Never Used  Substance and Sexual Activity  . Alcohol use: Yes    Alcohol/week: 0.0 standard drinks    Comment: Occ  . Drug use: No  . Sexual activity: Yes    Partners: Male  Other Topics Concern  . Not on file  Social History Narrative   Lives with husband and children in a 2 story home.  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   Education: BS   Social Determinants of Health   Financial Resource Strain: Not on file  Food Insecurity: Not on file  Transportation Needs: Not on file  Physical Activity: Not on file  Stress: Not on file  Social Connections: Not on file   Allergies  Allergen Reactions  . Sulfa Antibiotics Hives and Nausea Only   Family History  Problem Relation Age of Onset  . Arthritis Father   . Hyperlipidemia Father   . Alzheimer's disease Father   . Hypertension Father        Paternal Family  . Arthritis Mother   . Arthritis Other   . Osteoarthritis Brother   . Arthritis Sister   . Diabetes Paternal Grandmother        PGGM  . Heart disease Other        Pateternal Family  . Stroke Other        Paternal Family      Current Outpatient Medications (Respiratory):  .  albuterol (VENTOLIN HFA) 108 (90 Base) MCG/ACT inhaler, Inhale 2 puffs into the lungs every 6 (six) hours as needed for wheezing or shortness of breath. .  fluticasone (FLONASE) 50 MCG/ACT nasal spray, Place 2 sprays into both nostrils daily. Marland Kitchen  loratadine (CLARITIN) 10 MG tablet, Take 10 mg by mouth daily. .  montelukast (SINGULAIR) 10 MG tablet, TAKE ONE TABLET  BY MOUTH DAILY AT BEDTIME  .  pseudoephedrine (SUDAFED) 30 MG tablet, Take 30 mg by mouth every 4 (four) hours as needed for congestion.  Current Outpatient Medications (Analgesics):  .  aspirin 81 MG tablet, Take 81 mg by mouth daily. .  meloxicam (MOBIC) 15 MG tablet, Take 1 tablet (15 mg total) by mouth daily. .  meloxicam (MOBIC) 15 MG tablet, Take 1 tablet (15 mg total) by mouth daily.  Current Outpatient Medications (Hematological):  .  vitamin B-12 (CYANOCOBALAMIN) 1000 MCG tablet, Take 1,000 mcg by mouth daily.  Current Outpatient Medications (Other):  .  azithromycin (ZITHROMAX Z-PAK) 250 MG tablet, 2 tabs a day the first day, then 1 tab a day x 4 days .  buPROPion (WELLBUTRIN XL) 300 MG 24 hr tablet, Take 1 tablet (300 mg total) by mouth daily. Marland Kitchen   co-enzyme Q-10 30 MG capsule, Take 30 mg by mouth 3 (three) times daily. .  Collagen Hydrolysate, Bovine, POWD, by Does not apply route. Marland Kitchen  DHEA 10 MG TABS, Take by mouth. .  diazepam (VALIUM) 5 MG tablet, Take 1 tablet (5 mg total) by mouth 3 (three) times daily as needed for anxiety. .  Diclofenac Sodium 2 % SOLN, Apply twice daily. Marland Kitchen  gabapentin (NEURONTIN) 300 MG capsule, Take 1 capsule (300 mg total) by mouth at bedtime. Marland Kitchen  glucosamine-chondroitin 500-400 MG tablet, Take 1 tablet by mouth 2 (two) times daily. Marland Kitchen  lidocaine (XYLOCAINE) 5 % ointment, Apply 1 application topically 3 (three) times daily as needed. Use gloves when applying to left leg. .  magnesium oxide (MAG-OX) 400 MG tablet, Take 400 mg by mouth daily. Carlene Coria Mushroom POWD, by Does not apply route. .  methylphenidate (RITALIN) 20 MG tablet, Take 1 tablet (20 mg total) by mouth 2 (two) times daily. .  methylphenidate (RITALIN) 20 MG tablet, Take 1 tablet (20 mg total) by mouth 2 (two) times daily. .  methylphenidate (RITALIN) 20 MG tablet, Take 1 tablet (20 mg total) by mouth 2 (two) times daily. .  Misc Natural Products (PROGESTERONE EX), Apply topically daily. Marland Kitchen  NIACIN CR PO, Take by mouth. .  NONFORMULARY OR COMPOUNDED ITEM, Lipid, hep-- hyperlipidemia (Patient not taking: Reported on 05/12/2020) .  NONFORMULARY OR COMPOUNDED ITEM, Cbcd, cmp, lipid, tsh----- dx complete physical exam (Patient not taking: Reported on 05/12/2020) .  NONFORMULARY OR COMPOUNDED ITEM, Free t3 Free t4 Dx preventative health exam (Patient not taking: Reported on 05/12/2020) .  Omega-3 Fatty Acids (FISH OIL) 1000 MG CAPS, Take 1 capsule by mouth. Marland Kitchen  omeprazole (PRILOSEC OTC) 20 MG tablet, Take 20 mg by mouth daily. .  Potassium 99 MG TABS, Take by mouth. .  Probiotic Product (PROBIOTIC DAILY PO), Take by mouth. .  tazarotene (TAZORAC) 0.05 % cream, Apply topically at bedtime. .  Turmeric 500 MG CAPS, Take by mouth. .  valACYclovir (VALTREX)  1000 MG tablet, 1 po qd .  zolpidem (AMBIEN) 5 MG tablet, Take 1 tablet (5 mg total) by mouth at bedtime as needed for sleep.   Reviewed prior external information including notes and imaging from  primary care provider As well as notes that were available from care everywhere and other healthcare systems.  Past medical history, social, surgical and family history all reviewed in electronic medical record.  No pertanent information unless stated regarding to the chief complaint.   Review of Systems:  No headache, visual changes, nausea, vomiting, diarrhea, constipation, dizziness, abdominal pain, skin rash, fevers, chills,  night sweats, weight loss, swollen lymph nodes, body aches, joint swelling, chest pain, shortness of breath, mood changes. POSITIVE muscle aches  Objective  Last menstrual period 06/13/2016.   General: No apparent distress alert and oriented x3 mood and affect normal, dressed appropriately.  HEENT: Pupils equal, extraocular movements intact  Respiratory: Patient's speak in full sentences and does not appear short of breath  Cardiovascular: No lower extremity edema, non tender, no erythema  Gait normal with good balance and coordination.  MSK:  Non tender with full range of motion and good stability and symmetric strength and tone of shoulders, elbows, wrist, hip, knee and ankles bilaterally.     Impression and Recommendations:     The above documentation has been reviewed and is accurate and complete Wilford Grist

## 2020-09-18 ENCOUNTER — Ambulatory Visit: Payer: Self-pay | Admitting: Family Medicine

## 2020-09-20 ENCOUNTER — Other Ambulatory Visit: Payer: Self-pay | Admitting: Family Medicine

## 2020-09-24 ENCOUNTER — Ambulatory Visit: Payer: Self-pay | Admitting: Family Medicine

## 2020-09-30 ENCOUNTER — Other Ambulatory Visit: Payer: Self-pay

## 2020-09-30 ENCOUNTER — Encounter: Payer: Self-pay | Admitting: Family Medicine

## 2020-09-30 ENCOUNTER — Ambulatory Visit: Payer: Self-pay

## 2020-09-30 ENCOUNTER — Ambulatory Visit (INDEPENDENT_AMBULATORY_CARE_PROVIDER_SITE_OTHER): Payer: Self-pay | Admitting: Family Medicine

## 2020-09-30 VITALS — BP 124/80 | HR 93 | Ht 63.0 in | Wt 185.0 lb

## 2020-09-30 DIAGNOSIS — M25532 Pain in left wrist: Secondary | ICD-10-CM

## 2020-09-30 DIAGNOSIS — G5602 Carpal tunnel syndrome, left upper limb: Secondary | ICD-10-CM

## 2020-09-30 NOTE — Assessment & Plan Note (Signed)
Chronic problem with exacerbation.  Repeat injection given again today.  Patient tolerated the procedure well with some good improvement.  We will continue the bracing and the exercises.  Discussed potential need for surgical intervention with patient having some weakness.  Secondary to patient's difficult situation where patient does not have insurance and cannot take time off of work she wants to continue to avoid surgery if possible.  Can repeat injection again if needed but would like to wait at least 3 months.  Follow-up again in 3 months.

## 2020-09-30 NOTE — Progress Notes (Signed)
[name] [address] [place] [address] Phone: [phone] Subjective:   [name], am serving as a scribe for Dr. Antoine Primas. This visit occurred during the SARS-CoV-2 public health emergency.  Safety protocols were in place, including screening questions prior to the visit, additional usage of staff PPE, and extensive cleaning of exam room while observing appropriate contact time as indicated for disinfecting solutions.   I'm seeing this patient by the request  of:  Donato Schultz, DO  CC: Left wrist pain  [id number]  Cindy Stephens is a 55 y.o. female coming in with complaint of left wrist pain.  Patient has known left-sided carpal tunnel.  Injection given back in June 2021. Patient is having constant pain and feels that she cannot stand the pain any longer. Using brace at night and a soft brace during the day. Using gabapentin and Meloxicam.      Past Medical History:  Diagnosis Date  . Allergic rhinitis   . Depression   . GERD (gastroesophageal reflux disease)   . Shoulder dislocation 2007   Left Shoulder  . Urinary incontinence    Past Surgical History:  Procedure Laterality Date  . APPENDECTOMY    . ELBOW SURGERY Left 2007   elbow was shattered   Social History   Socioeconomic History  . Marital status: Married    Spouse name: Not on file  . Number of children: Not on file  . Years of education: Not on file  . Highest education level: Not on file  Occupational History  . Not on file  Tobacco Use  . Smoking status: Never Smoker  . Smokeless tobacco: Never Used  Substance and Sexual Activity  . Alcohol use: Yes    Alcohol/week: 0.0 standard drinks    Comment: Occ  . Drug use: No  . Sexual activity: Yes    Partners: Male  Other Topics Concern  . Not on file  Social History Narrative   Lives with husband and children in a 2 story home.  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  Education: BS   Social  Determinants of Health   Financial Resource Strain: Not on file  Food Insecurity: Not on file  Transportation Needs: Not on file  Physical Activity: Not on file  Stress: Not on file  Social Connections: Not on file   Allergies  Allergen Reactions  . Sulfa Antibiotics Hives and Nausea Only   Family History  Problem Relation Age of Onset  . Arthritis Father   . Hyperlipidemia Father   . Alzheimer's disease Father   . Hypertension Father        Paternal Family  . Arthritis Mother   . Arthritis Other   . Osteoarthritis Brother   . Arthritis Sister   . Diabetes Paternal Grandmother        PGGM  . Heart disease Other        Pateternal Family  . Stroke Other        Paternal Family      Current Outpatient Medications (Respiratory):  .  albuterol (VENTOLIN HFA) 108 (90 Base) MCG/ACT inhaler, Inhale 2 puffs into the lungs every 6 (six) hours as needed for wheezing or shortness of breath. .  fluticasone (FLONASE) 50 MCG/ACT nasal spray, Place 2 sprays into both nostrils daily. Marland Kitchen  loratadine (CLARITIN) 10 MG tablet, Take 10 mg by mouth daily. .  montelukast (SINGULAIR) 10 MG tablet, TAKE ONE TABLET BY MOUTH DAILY  AT BEDTIME  .  pseudoephedrine (SUDAFED) 30 MG tablet, Take 30 mg by mouth every 4 (four) hours as needed for congestion.  Current Outpatient Medications (Analgesics):  .  aspirin 81 MG tablet, Take 81 mg by mouth daily. .  meloxicam (MOBIC) 15 MG tablet, Take 1 tablet (15 mg total) by mouth daily. .  meloxicam (MOBIC) 15 MG tablet, Take 1 tablet (15 mg total) by mouth daily.  Current Outpatient Medications (Hematological):  .  vitamin B-12 (CYANOCOBALAMIN) 1000 MCG tablet, Take 1,000 mcg by mouth daily.  Current Outpatient Medications (Other):  .  azithromycin (ZITHROMAX Z-PAK) 250 MG tablet, 2 tabs a day the first day, then 1 tab a day x 4 days .  buPROPion (WELLBUTRIN XL) 300 MG 24 hr tablet, Take 1 tablet (300 mg total) by mouth daily. Marland Kitchen  co-enzyme Q-10 30 MG  capsule, Take 30 mg by mouth 3 (three) times daily. .  Collagen Hydrolysate, Bovine, POWD, by Does not apply route. Marland Kitchen  DHEA 10 MG TABS, Take by mouth. .  diazepam (VALIUM) 5 MG tablet, Take 1 tablet (5 mg total) by mouth 3 (three) times daily as needed for anxiety. .  Diclofenac Sodium 2 % SOLN, Apply twice daily. Marland Kitchen  gabapentin (NEURONTIN) 300 MG capsule, Take 1 capsule by mouth at bedtime .  glucosamine-chondroitin 500-400 MG tablet, Take 1 tablet by mouth 2 (two) times daily. Marland Kitchen  lidocaine (XYLOCAINE) 5 % ointment, Apply 1 application topically 3 (three) times daily as needed. Use gloves when applying to left leg. .  magnesium oxide (MAG-OX) 400 MG tablet, Take 400 mg by mouth daily. Carlene [place] Mushroom POWD, by Does not apply route. .  methylphenidate (RITALIN) 20 MG tablet, Take 1 tablet (20 mg total) by mouth 2 (two) times daily. .  methylphenidate (RITALIN) 20 MG tablet, Take 1 tablet (20 mg total) by mouth 2 (two) times daily. .  methylphenidate (RITALIN) 20 MG tablet, Take 1 tablet (20 mg total) by mouth 2 (two) times daily. .  Misc Natural Products (PROGESTERONE EX), Apply topically daily. Marland Kitchen  NIACIN CR PO, Take by mouth. .  NONFORMULARY OR COMPOUNDED ITEM, Lipid, hep-- hyperlipidemia (Patient not taking: Reported on 05/12/2020) .  NONFORMULARY OR COMPOUNDED ITEM, Cbcd, cmp, lipid, tsh----- dx complete physical exam (Patient not taking: Reported on 05/12/2020) .  NONFORMULARY OR COMPOUNDED ITEM, Free t3 Free t4 Dx preventative health exam (Patient not taking: Reported on 05/12/2020) .  Omega-3 Fatty Acids (FISH OIL) 1000 MG CAPS, Take 1 capsule by mouth. Marland Kitchen  omeprazole (PRILOSEC OTC) 20 MG tablet, Take 20 mg by mouth daily. .  Potassium 99 MG TABS, Take by mouth. .  Probiotic Product (PROBIOTIC DAILY PO), Take by mouth. .  tazarotene (TAZORAC) 0.05 % cream, Apply topically at bedtime. .  Turmeric 500 MG CAPS, Take by mouth. .  valACYclovir (VALTREX) 1000 MG tablet, 1 po qd .  zolpidem  (AMBIEN) 5 MG tablet, Take 1 tablet (5 mg total) by mouth at bedtime as needed for sleep.   Reviewed prior external information including notes and imaging from  primary care provider As well as notes that were available from care everywhere and other healthcare systems.  Past medical history, social, surgical and family history all reviewed in electronic medical record.  No pertanent information unless stated regarding to the chief complaint.   Review of Systems:  No headache, visual changes, nausea, vomiting, diarrhea, constipation, dizziness, abdominal pain, skin rash, fevers, chills, night sweats, weight loss, swollen lymph  nodes, body aches, joint swelling, chest pain, shortness of breath, mood changes. POSITIVE muscle aches  Objective  Last menstrual period 06/13/2016.   General: No apparent distress alert and oriented x3 mood and affect normal, dressed appropriately.  HEENT: Pupils equal, extraocular movements intact  Respiratory: Patient's speak in full sentences and does not appear short of breath  Cardiovascular: No lower extremity edema, non tender, no erythema  Gait normal with good balance and coordination.  MSK: Left wrist exam shows patient does have tenderness to palpation over the left wrist itself.  Patient does have some thenar eminence wasting of the left wrist noted compared to the contralateral side.  Grip strength minimally weaker compared to the contralateral side as well.  Positive Tinel's noted.  Procedure: Real-time Ultrasound Guided Injection of right carpal tunnel Device: GE Logiq Q7 Ultrasound guided injection is preferred based studies that show increased duration, increased effect, greater accuracy, decreased procedural pain, increased response rate with ultrasound guided versus blind injection.  Verbal informed consent obtained.  Time-out conducted.  Noted no overlying erythema, induration, or other signs of local infection.  Skin prepped in a sterile  fashion.  Local anesthesia: Topical Ethyl chloride.  With sterile technique and under real time ultrasound guidance:  median nerve visualized.  23g 5/8 inch needle inserted distal to proximal approach into nerve sheath. Pictures taken nfor needle placement. Patient did have injection of 0.5 cc of 0.5% Marcaine, and 0.5 cc of Kenalog 40 mg/dL. Completed without difficulty  Pain immediately improved suggesting accurate placement of the medication.  Advised to call if fevers/chills, erythema, induration, drainage, or persistent bleeding.  Impression: Technically successful ultrasound guided injection.    Impression and Recommendations:     The above documentation has been reviewed and is accurate and complete Judi Saa, DO

## 2020-09-30 NOTE — Patient Instructions (Addendum)
Good to see you Injected wrist Carpal tunnel looks severe we can injection every 3-6 months Continue meloxicam  See me when you need me

## 2020-10-29 ENCOUNTER — Encounter: Payer: Self-pay | Admitting: Family Medicine

## 2020-10-29 ENCOUNTER — Ambulatory Visit (INDEPENDENT_AMBULATORY_CARE_PROVIDER_SITE_OTHER): Payer: Self-pay | Admitting: Family Medicine

## 2020-10-29 ENCOUNTER — Other Ambulatory Visit: Payer: Self-pay

## 2020-10-29 VITALS — BP 120/78 | HR 102 | Temp 98.3°F | Resp 18 | Ht 63.0 in | Wt 178.8 lb

## 2020-10-29 DIAGNOSIS — W57XXXA Bitten or stung by nonvenomous insect and other nonvenomous arthropods, initial encounter: Secondary | ICD-10-CM

## 2020-10-29 DIAGNOSIS — S2096XA Insect bite (nonvenomous) of unspecified parts of thorax, initial encounter: Secondary | ICD-10-CM

## 2020-10-29 MED ORDER — DOXYCYCLINE HYCLATE 100 MG PO TABS: 200.0000 mg | ORAL_TABLET | Freq: Once | ORAL | 0 refills | Status: AC

## 2020-10-29 MED ORDER — DIAZEPAM 5 MG PO TABS: 5.0000 mg | ORAL_TABLET | Freq: Three times a day (TID) | ORAL | 0 refills | Status: DC | PRN

## 2020-10-29 NOTE — Patient Instructions (Signed)
Let me know if a bullseye rash develops.   Let us know if you need anything.

## 2020-10-29 NOTE — Progress Notes (Signed)
Chief Complaint  Patient presents with  . Tick Bite    Pt states having 6 tick bites on her back. Pt states getting them on Sunday.     Cindy Stephens is a 55 y.o. female here for a skin complaint.  Duration: 4 days Location: back, sides, belly, top of butt Pruritic? No Painful? No Drainage? No Bit by ticks Other associated symptoms: no fevers or joint pain Therapies tried thus far: none  Past Medical History:  Diagnosis Date  . Allergic rhinitis   . Depression   . GERD (gastroesophageal reflux disease)   . Shoulder dislocation 2007   Left Shoulder  . Urinary incontinence     BP 120/78 (BP Location: Right Arm, Patient Position: Sitting, Cuff Size: Large)   Pulse (!) 102   Temp 98.3 F (36.8 C) (Oral)   Resp 18   Ht 5\' 3"  (1.6 m)   Wt 178 lb 12.8 oz (81.1 kg)   LMP 06/13/2016   SpO2 97%   BMI 31.67 kg/m  Gen: awake, alert, appearing stated age Lungs: No accessory muscle use Skin: no target lesions or external lesions noted. No drainage, erythema, TTP, fluctuance, excoriation Psych: Age appropriate judgment and insight  Tick bite of thoracic wall, unspecified whether front or back, initial encounter - Plan: doxycycline (VIBRA-TABS) 100 MG tablet  Discussed lyme prophylaxis. Will do a single dose of 200 mg. She will let me know if she develops a target lesion.  F/u prn. The patient voiced understanding and agreement to the plan.  06/15/2016 [place], DO 10/29/20 3:17 PM

## 2021-01-23 ENCOUNTER — Other Ambulatory Visit: Payer: Self-pay | Admitting: *Deleted

## 2021-01-23 MED ORDER — MELOXICAM 15 MG PO TABS: 15.0000 mg | ORAL_TABLET | Freq: Every day | ORAL | 0 refills | Status: DC

## 2021-01-23 MED ORDER — GABAPENTIN 300 MG PO CAPS: 300.0000 mg | ORAL_CAPSULE | Freq: Every day | ORAL | 0 refills | Status: DC

## 2021-01-23 NOTE — Telephone Encounter (Signed)
Pt called requesting a refill of meloxicam & gabapentin. Refills sent [facility].

## 2021-03-03 ENCOUNTER — Ambulatory Visit: Payer: Self-pay | Admitting: Family Medicine

## 2021-03-03 ENCOUNTER — Ambulatory Visit: Payer: Self-pay

## 2021-03-03 ENCOUNTER — Encounter: Payer: Self-pay | Admitting: Family Medicine

## 2021-03-03 ENCOUNTER — Other Ambulatory Visit: Payer: Self-pay

## 2021-03-03 VITALS — BP 134/88 | HR 86 | Ht 63.0 in | Wt 186.0 lb

## 2021-03-03 DIAGNOSIS — G5602 Carpal tunnel syndrome, left upper limb: Secondary | ICD-10-CM

## 2021-03-03 DIAGNOSIS — M25532 Pain in left wrist: Secondary | ICD-10-CM

## 2021-03-03 NOTE — Assessment & Plan Note (Signed)
Repeat injection given again today.  Tolerated the procedure well.  Discussed icing regimen and home exercises.  Discussed the potential need for surgical intervention but right now is not the time for her.  Discussed avoiding certain activities.  Follow-up with me again in 6 to 8 weeks.  Continue the gabapentin.

## 2021-03-03 NOTE — Patient Instructions (Addendum)
Injected L wrist today Coban to R wrist See me again in 6-8 weeks

## 2021-03-03 NOTE — Progress Notes (Signed)
[facility] [address] [place] [address] Phone: [phone] Subjective:   [name], am serving as a scribe for Dr. Antoine Primas. This visit occurred during the SARS-CoV-2 public health emergency.  Safety protocols were in place, including screening questions prior to the visit, additional usage of staff PPE, and extensive cleaning of exam room while observing appropriate contact time as indicated for disinfecting solutions.   I'm seeing this patient by the request  of:  Donato Schultz, DO  CC: Left wrist pain follow-up  [id number]  09/30/2020 Chronic problem with exacerbation.  Repeat injection given again today.  Patient tolerated the procedure well with some good improvement.  We will continue the bracing and the exercises.  Discussed potential need for surgical intervention with patient having some weakness.  Secondary to patient's difficult situation where patient does not have insurance and cannot take time off of work she wants to continue to avoid surgery if possible.  Can repeat injection again if needed but would like to wait at least 3 months.  Follow-up again in 3 months.  Update 03/03/2021 Cindy Stephens is a 55 y.o. female coming in with complaint of L wrist pain. Patient states that her pain has increased recently quit quickly over past 3 weeks. Yesterday was a bad day. Has made ergonomic changes to her keyboard and work station. Using Ted's Pain Cream that has helped her pain from yesterday to today.      Past Medical History:  Diagnosis Date   Allergic rhinitis    Depression    GERD (gastroesophageal reflux disease)    Shoulder dislocation 2007   Left Shoulder   Urinary incontinence    Past Surgical History:  Procedure Laterality Date   APPENDECTOMY     ELBOW SURGERY Left 2007   elbow was shattered   Social History   Socioeconomic History   Marital status: Married    Spouse name: Not on file   Number of  children: Not on file   Years of education: Not on file   Highest education level: Not on file  Occupational History   Not on file  Tobacco Use   Smoking status: Never   Smokeless tobacco: Never  Substance and Sexual Activity   Alcohol use: Yes    Alcohol/week: 0.0 standard drinks    Comment: Occ   Drug use: No   Sexual activity: Yes    Partners: Male  Other Topics Concern   Not on file  Social History Narrative   Lives with husband and children in a 2 story home.  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  Education: BS   Social Determinants of Corporate investment banker Strain: Not on file  Food Insecurity: Not on file  Transportation Needs: Not on file  Physical Activity: Not on file  Stress: Not on file  Social Connections: Not on file   Allergies  Allergen Reactions   Sulfa Antibiotics Hives and Nausea Only   Family History  Problem Relation Age of Onset   Arthritis Father    Hyperlipidemia Father    Alzheimer's disease Father    Hypertension Father        Paternal Family   Arthritis Mother    Arthritis Other    Osteoarthritis Brother    Arthritis Sister    Diabetes Paternal Grandmother        PGGM   Heart disease Other        Pateternal Family  Stroke Other        Paternal Family      Current Outpatient Medications (Respiratory):    albuterol (VENTOLIN HFA) 108 (90 Base) MCG/ACT inhaler, Inhale 2 puffs into the lungs every 6 (six) hours as needed for wheezing or shortness of breath.   fluticasone (FLONASE) 50 MCG/ACT nasal spray, Place 2 sprays into both nostrils daily.   loratadine (CLARITIN) 10 MG tablet, Take 10 mg by mouth daily.   montelukast (SINGULAIR) 10 MG tablet, TAKE ONE TABLET BY MOUTH DAILY AT BEDTIME    pseudoephedrine (SUDAFED) 30 MG tablet, Take 30 mg by mouth every 4 (four) hours as needed for congestion.  Current Outpatient Medications (Analgesics):    aspirin 81 MG tablet, Take 81 mg by mouth daily.   meloxicam (MOBIC) 15 MG tablet,  Take 1 tablet (15 mg total) by mouth daily.   meloxicam (MOBIC) 15 MG tablet, Take 1 tablet (15 mg total) by mouth daily.  Current Outpatient Medications (Hematological):    vitamin B-12 (CYANOCOBALAMIN) 1000 MCG tablet, Take 1,000 mcg by mouth daily.  Current Outpatient Medications (Other):    buPROPion (WELLBUTRIN XL) 300 MG 24 hr tablet, Take 1 tablet (300 mg total) by mouth daily.   co-enzyme Q-10 30 MG capsule, Take 30 mg by mouth 3 (three) times daily.   Collagen Hydrolysate, Bovine, POWD, by Does not apply route.   DHEA 10 MG TABS, Take by mouth.   diazepam (VALIUM) 5 MG tablet, Take 1 tablet (5 mg total) by mouth 3 (three) times daily as needed for anxiety.   Diclofenac Sodium 2 % SOLN, Apply twice daily.   gabapentin (NEURONTIN) 300 MG capsule, Take 1 capsule (300 mg total) by mouth at bedtime.   glucosamine-chondroitin 500-400 MG tablet, Take 1 tablet by mouth 2 (two) times daily.   lidocaine (XYLOCAINE) 5 % ointment, Apply 1 application topically 3 (three) times daily as needed. Use gloves when applying to left leg.   magnesium oxide (MAG-OX) 400 MG tablet, Take 400 mg by mouth daily.   [place] Mushroom POWD, by Does not apply route.   methylphenidate (RITALIN) 20 MG tablet, Take 1 tablet (20 mg total) by mouth 2 (two) times daily.   methylphenidate (RITALIN) 20 MG tablet, Take 1 tablet (20 mg total) by mouth 2 (two) times daily.   methylphenidate (RITALIN) 20 MG tablet, Take 1 tablet (20 mg total) by mouth 2 (two) times daily.   Misc Natural Products (PROGESTERONE EX), Apply topically daily.   NIACIN CR PO, Take by mouth.   NONFORMULARY OR COMPOUNDED ITEM, Lipid, hep-- hyperlipidemia   NONFORMULARY OR COMPOUNDED ITEM, Cbcd, cmp, lipid, tsh----- dx complete physical exam   NONFORMULARY OR COMPOUNDED ITEM, Free t3 Free t4 Dx preventative health exam   Omega-3 Fatty Acids (FISH OIL) 1000 MG CAPS, Take 1 capsule by mouth.   omeprazole (PRILOSEC OTC) 20 MG tablet, Take 20 mg by  mouth daily.   Potassium 99 MG TABS, Take by mouth.   Probiotic Product (PROBIOTIC DAILY PO), Take by mouth.   tazarotene (TAZORAC) 0.05 % cream, Apply topically at bedtime.   Turmeric 500 MG CAPS, Take by mouth.   valACYclovir (VALTREX) 1000 MG tablet, 1 po qd   zolpidem (AMBIEN) 5 MG tablet, Take 1 tablet (5 mg total) by mouth at bedtime as needed for sleep.   Reviewed prior external information including notes and imaging from  primary care provider As well as notes that were available from care everywhere and other healthcare systems.  Past medical history, social, surgical and family history all reviewed in electronic medical record.  No pertanent information unless stated regarding to the chief complaint.   Review of Systems:  No headache, visual changes, nausea, vomiting, diarrhea, constipation, dizziness, abdominal pain, skin rash, fevers, chills, night sweats, weight loss, swollen lymph nodes, body aches, joint swelling, chest pain, shortness of breath, mood changes. POSITIVE muscle aches  Objective  Blood pressure 134/88, pulse 86, height 5\' 3"  (1.6 m), weight 186 lb (84.4 kg), last menstrual period 06/13/2016, SpO2 96 %.   General: No apparent distress alert and oriented x3 mood and affect normal, dressed appropriately.  HEENT: Pupils equal, extraocular movements intact  Respiratory: Patient's speak in full sentences and does not appear short of breath  Cardiovascular: No lower extremity edema, non tender, no erythema  Gait normal with good balance and coordination.  MSK: Left wrist exam still has good grip strength noted.  Positive Tinel and Phalen's test.  Neurovascular intact distally.  Procedure: Real-time Ultrasound Guided Injection of left carpal tunnel Device: GE Logiq Q7 Ultrasound guided injection is preferred based studies that show increased duration, increased effect, greater accuracy, decreased procedural pain, increased response rate with ultrasound guided  versus blind injection.  Verbal informed consent obtained.  Time-out conducted.  Noted no overlying erythema, induration, or other signs of local infection.  Skin prepped in a sterile fashion.  Local anesthesia: Topical Ethyl chloride.  With sterile technique and under real time ultrasound guidance:  median nerve visualized.  23g 5/8 inch needle inserted distal to proximal approach into nerve sheath. Pictures taken nfor needle placement. Patient did have injection of 0.5 cc of 0.5% Marcaine, and 0.5 cc of Kenalog 40 mg/dL. Completed without difficulty  Pain immediately resolved suggesting accurate placement of the medication.  Advised to call if fevers/chills, erythema, induration, drainage, or persistent bleeding.  Impression: Technically successful ultrasound guided injection.    Impression and Recommendations:    The above documentation has been reviewed and is accurate and complete 7/8, DO

## 2021-03-05 ENCOUNTER — Ambulatory Visit: Payer: Self-pay | Admitting: Family Medicine

## 2021-03-20 ENCOUNTER — Ambulatory Visit: Payer: Self-pay | Admitting: Family Medicine

## 2021-04-08 ENCOUNTER — Encounter: Payer: Self-pay | Admitting: *Deleted

## 2021-04-14 ENCOUNTER — Ambulatory Visit: Payer: Self-pay | Admitting: Family Medicine

## 2021-07-16 ENCOUNTER — Other Ambulatory Visit: Payer: Self-pay | Admitting: *Deleted

## 2021-07-16 DIAGNOSIS — K1379 Other lesions of oral mucosa: Secondary | ICD-10-CM

## 2021-07-16 MED ORDER — MELOXICAM 15 MG PO TABS: 15.0000 mg | ORAL_TABLET | Freq: Every day | ORAL | 0 refills | Status: DC

## 2021-07-16 MED ORDER — GABAPENTIN 300 MG PO CAPS: 300.0000 mg | ORAL_CAPSULE | Freq: Every day | ORAL | 0 refills | Status: DC

## 2021-07-16 NOTE — Telephone Encounter (Signed)
Pt called requesting refills of gabapentin 300mg  & meloxicam 15mg . Rx sent into [facility].

## 2021-10-11 ENCOUNTER — Other Ambulatory Visit: Payer: Self-pay | Admitting: Family Medicine

## 2021-10-11 DIAGNOSIS — K1379 Other lesions of oral mucosa: Secondary | ICD-10-CM

## 2021-10-14 ENCOUNTER — Telehealth: Payer: Self-pay | Admitting: Family Medicine

## 2021-10-19 ENCOUNTER — Other Ambulatory Visit: Payer: Self-pay

## 2021-10-19 MED ORDER — GABAPENTIN 300 MG PO CAPS: ORAL_CAPSULE | ORAL | 0 refills | Status: DC

## 2021-10-19 NOTE — Telephone Encounter (Signed)
Rx called in 

## 2021-10-19 NOTE — Telephone Encounter (Signed)
Pt called stating she did not received a refill for gabapentin on 5/1. She did receive the meloxican but the pharmacy never received the refills that were sent in for gabapentin ?

## 2021-12-09 NOTE — Progress Notes (Unsigned)
[name] [address] [place] [address] Phone: [phone] Subjective:   [name], am serving as a scribe for Dr. Antoine Primas.  I'm seeing this patient by the request  of:  Donato Schultz, DO  CC: Left wrist pain, allover pain  [id number]  03/03/2021 Repeat injection given again today.  Tolerated the procedure well.  Discussed icing regimen and home exercises.  Discussed the potential need for surgical intervention but right now is not the time for her.  Discussed avoiding certain activities.  Follow-up with me again in 6 to 8 weeks.  Continue the gabapentin.  Updated 12/10/2021 Andrea R Balazs is a 56 y.o. female coming in with complaint of left wrist pain. Also having R wrist pain especially with keying. Does not feel like injection last September did not work as well as previous injection. Pain increased over past month.   Patient states that she has pain in many other areas as well: L shoulder, B knee L>R, and R hip pain. Has been doing some HEP from Youtube. Notes locking in knee and pain with stair climbing.         Past Medical History:  Diagnosis Date   Allergic rhinitis    Depression    GERD (gastroesophageal reflux disease)    Shoulder dislocation 2007   Left Shoulder   Urinary incontinence    Past Surgical History:  Procedure Laterality Date   APPENDECTOMY     ELBOW SURGERY Left 2007   elbow was shattered   Social History   Socioeconomic History   Marital status: Married    Spouse name: Not on file   Number of children: Not on file   Years of education: Not on file   Highest education level: Not on file  Occupational History   Not on file  Tobacco Use   Smoking status: Never   Smokeless tobacco: Never  Substance and Sexual Activity   Alcohol use: Yes    Alcohol/week: 0.0 standard drinks of alcohol    Comment: Occ   Drug use: No   Sexual activity: Yes    Partners: Male  Other Topics Concern    Not on file  Social History Narrative   Lives with husband and children in a 2 story home.  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  Education: BS   Social Determinants of Corporate investment banker Strain: Not on file  Food Insecurity: Not on file  Transportation Needs: Not on file  Physical Activity: Not on file  Stress: Not on file  Social Connections: Not on file   Allergies  Allergen Reactions   Sulfa Antibiotics Hives and Nausea Only   Family History  Problem Relation Age of Onset   Arthritis Father    Hyperlipidemia Father    Alzheimer's disease Father    Hypertension Father        Paternal Family   Arthritis Mother    Arthritis Other    Osteoarthritis Brother    Arthritis Sister    Diabetes Paternal Grandmother        PGGM   Heart disease Other        Pateternal Family   Stroke Other        Paternal Family      Current Outpatient Medications (Respiratory):    albuterol (VENTOLIN HFA) 108 (90 Base) MCG/ACT inhaler, Inhale 2 puffs into the lungs every 6 (six) hours as needed for wheezing or shortness of breath.  fluticasone (FLONASE) 50 MCG/ACT nasal spray, Place 2 sprays into both nostrils daily.   loratadine (CLARITIN) 10 MG tablet, Take 10 mg by mouth daily.   montelukast (SINGULAIR) 10 MG tablet, TAKE ONE TABLET BY MOUTH DAILY AT BEDTIME    pseudoephedrine (SUDAFED) 30 MG tablet, Take 30 mg by mouth every 4 (four) hours as needed for congestion.  Current Outpatient Medications (Analgesics):    aspirin 81 MG tablet, Take 81 mg by mouth daily.   meloxicam (MOBIC) 15 MG tablet, Take 1 tablet (15 mg total) by mouth daily.   meloxicam (MOBIC) 15 MG tablet, TAKE ONE TABLET BY MOUTH ONE TIME DAILY  Current Outpatient Medications (Hematological):    vitamin B-12 (CYANOCOBALAMIN) 1000 MCG tablet, Take 1,000 mcg by mouth daily.  Current Outpatient Medications (Other):    buPROPion (WELLBUTRIN XL) 300 MG 24 hr tablet, Take 1 tablet (300 mg total) by mouth daily.    co-enzyme Q-10 30 MG capsule, Take 30 mg by mouth 3 (three) times daily.   Collagen Hydrolysate, Bovine, POWD, by Does not apply route.   DHEA 10 MG TABS, Take by mouth.   diazepam (VALIUM) 5 MG tablet, Take 1 tablet (5 mg total) by mouth 3 (three) times daily as needed for anxiety.   Diclofenac Sodium 2 % SOLN, Apply twice daily.   DULoxetine (CYMBALTA) 20 MG capsule, Take 1 capsule (20 mg total) by mouth daily.   gabapentin (NEURONTIN) 400 MG capsule, Take 1 capsule (400 mg total) by mouth 3 (three) times daily.   glucosamine-chondroitin 500-400 MG tablet, Take 1 tablet by mouth 2 (two) times daily.   lidocaine (XYLOCAINE) 5 % ointment, Apply 1 application topically 3 (three) times daily as needed. Use gloves when applying to left leg.   magnesium oxide (MAG-OX) 400 MG tablet, Take 400 mg by mouth daily.   [place] Mushroom POWD, by Does not apply route.   methylphenidate (RITALIN) 20 MG tablet, Take 1 tablet (20 mg total) by mouth 2 (two) times daily.   methylphenidate (RITALIN) 20 MG tablet, Take 1 tablet (20 mg total) by mouth 2 (two) times daily.   methylphenidate (RITALIN) 20 MG tablet, Take 1 tablet (20 mg total) by mouth 2 (two) times daily.   Misc Natural Products (PROGESTERONE EX), Apply topically daily.   NIACIN CR PO, Take by mouth.   NONFORMULARY OR COMPOUNDED ITEM, Lipid, hep-- hyperlipidemia   NONFORMULARY OR COMPOUNDED ITEM, Cbcd, cmp, lipid, tsh----- dx complete physical exam   NONFORMULARY OR COMPOUNDED ITEM, Free t3 Free t4 Dx preventative health exam   Omega-3 Fatty Acids (FISH OIL) 1000 MG CAPS, Take 1 capsule by mouth.   omeprazole (PRILOSEC OTC) 20 MG tablet, Take 20 mg by mouth daily.   Potassium 99 MG TABS, Take by mouth.   Probiotic Product (PROBIOTIC DAILY PO), Take by mouth.   tazarotene (TAZORAC) 0.05 % cream, Apply topically at bedtime.   Turmeric 500 MG CAPS, Take by mouth.   valACYclovir (VALTREX) 1000 MG tablet, 1 po qd   zolpidem (AMBIEN) 5 MG tablet, Take  1 tablet (5 mg total) by mouth at bedtime as needed for sleep.   Reviewed prior external information including notes and imaging from  primary care provider As well as notes that were available from care everywhere and other healthcare systems.  Past medical history, social, surgical and family history all reviewed in electronic medical record.  No pertanent information unless stated regarding to the chief complaint.   Review of Systems:  No headache, visual  changes, nausea, vomiting, diarrhea, constipation, dizziness, abdominal pain, skin rash, fevers, chills, night sweats, weight loss, swollen lymph nodes joint swelling, chest pain, shortness of breath, mood changes. POSITIVE muscle aches, body aches  Objective  Blood pressure 138/88, pulse 96, height 5\' 3"  (1.6 m), weight 188 lb (85.3 kg), last menstrual period 06/13/2016, SpO2 97 %.   General: No apparent distress alert and oriented x3 mood and affect normal, dressed appropriately.  HEENT: Pupils equal, extraocular movements intact  Respiratory: Patient's speak in full sentences and does not appear short of breath  Cardiovascular: No lower extremity edema, non tender, no erythema  Left wrist exam shows the patient does have some very mild thenar eminence wasting compared to the contralateral side.  Positive Tinel's and Phalen's noted.  Patient has good range of motion though noted over the arm otherwise.  Procedure: Real-time Ultrasound Guided Injection of left  carpal tunnel Device: GE Logiq Q7 Ultrasound guided injection is preferred based studies that show increased duration, increased effect, greater accuracy, decreased procedural pain, increased response rate with ultrasound guided versus blind injection.  Verbal informed consent obtained.  Time-out conducted.  Noted no overlying erythema, induration, or other signs of local infection.  Skin prepped in a sterile fashion.  Local anesthesia: Topical Ethyl chloride.  With sterile  technique and under real time ultrasound guidance:  median nerve visualized.  23g 5/8 inch needle inserted distal to proximal approach into nerve sheath. Pictures taken nfor needle placement. Patient did have injection of 0.5 cc of 0.5% Marcaine, and 0.5 cc of Kenalog 40 mg/dL. Completed without difficulty  Pain immediately resolved suggesting accurate placement of the medication.  Advised to call if fevers/chills, erythema, induration, drainage, or persistent bleeding.  Impression: Technically successful ultrasound guided injection.   Impression and Recommendations:    The above documentation has been reviewed and is accurate and complete 7/8, DO

## 2021-12-10 ENCOUNTER — Encounter: Payer: Self-pay | Admitting: Family Medicine

## 2021-12-10 ENCOUNTER — Ambulatory Visit (INDEPENDENT_AMBULATORY_CARE_PROVIDER_SITE_OTHER): Payer: Self-pay | Admitting: Family Medicine

## 2021-12-10 ENCOUNTER — Ambulatory Visit: Payer: Self-pay

## 2021-12-10 VITALS — BP 138/88 | HR 96 | Ht 63.0 in | Wt 188.0 lb

## 2021-12-10 DIAGNOSIS — M2550 Pain in unspecified joint: Secondary | ICD-10-CM

## 2021-12-10 DIAGNOSIS — G5602 Carpal tunnel syndrome, left upper limb: Secondary | ICD-10-CM

## 2021-12-10 DIAGNOSIS — M25532 Pain in left wrist: Secondary | ICD-10-CM

## 2021-12-10 MED ORDER — DULOXETINE HCL 20 MG PO CPEP: 20.0000 mg | ORAL_CAPSULE | Freq: Every day | ORAL | 0 refills | Status: DC

## 2021-12-10 MED ORDER — GABAPENTIN 400 MG PO CAPS: 400.0000 mg | ORAL_CAPSULE | Freq: Three times a day (TID) | ORAL | 0 refills | Status: DC

## 2021-12-10 NOTE — Patient Instructions (Addendum)
Injected carpal tunnel today Cymbalta 20mg  daily Increase gabapentin 400mg  at night Give update in 4 weeks  Or see in 6-8 weeks if you need [place]

## 2021-12-10 NOTE — Assessment & Plan Note (Signed)
Patient hopefully will be doing much better at this time.  Chronic problem with worsening symptoms.  Patient given a name of a hand surgeon for the possibility of intervention as well.  We discussed with patient that we can continue the injection but we do need to watch for the thenar eminence wasting.  Patient will continue to do bracing at night.  We will start on Cymbalta.  Increase patient's gabapentin to 400 mg and see how patient responds.  Warned of potential side effects.  We also discussed labs which patient wants to hold at this point.

## 2021-12-10 NOTE — Assessment & Plan Note (Signed)
Patient has had difficulty with polyarthralgia over the course of time.  Discussed with patient that we could consider a laboratory work-up.  Has had a positive ANA previously.  In addition to this though fibromyalgia is within the differential.  Patient has elected to try Cymbalta and see how patient responds.  Patient does not have insurance at this time and is concerned of the cost of the laboratory work-up.  Patient can follow-up with me again in 2 months otherwise.

## 2021-12-25 ENCOUNTER — Telehealth: Payer: Self-pay | Admitting: Family Medicine

## 2021-12-25 NOTE — Telephone Encounter (Signed)
Pt needs refills of Cymbalta, Gabapentin, and Mobic to [facility]. Would like 90 day supplies.  Questions: she seems to be struggling with the best way to take these meds daily to manage her pain. Shot has not really helped.  She currently takes Mobic during the day and Cymbalta & Gabapentin at night.  Pt would be agreeable to an increased dosage of Cymbalta.  Please note: Gabapentin instructions show 400 mgs 3 times daily, pt thinks this is an error as she only take 1 daily and was only given 30 pills.

## 2021-12-28 NOTE — Telephone Encounter (Signed)
Sent patient MyChart message to confirm dosage.

## 2021-12-29 NOTE — Telephone Encounter (Signed)
Left message for patient to call back to confirm dosage.  

## 2021-12-29 NOTE — Telephone Encounter (Signed)
Left VM for pt to check MyChart communications or call the office to confirm dosages.

## 2021-12-30 ENCOUNTER — Other Ambulatory Visit: Payer: Self-pay

## 2021-12-30 DIAGNOSIS — K1379 Other lesions of oral mucosa: Secondary | ICD-10-CM

## 2021-12-30 MED ORDER — DULOXETINE HCL 20 MG PO CPEP: 20.0000 mg | ORAL_CAPSULE | Freq: Every day | ORAL | 0 refills | Status: DC

## 2021-12-30 MED ORDER — GABAPENTIN 400 MG PO CAPS: 400.0000 mg | ORAL_CAPSULE | Freq: Every day | ORAL | 0 refills | Status: DC

## 2021-12-30 MED ORDER — MELOXICAM 15 MG PO TABS: 15.0000 mg | ORAL_TABLET | Freq: Every day | ORAL | 0 refills | Status: DC

## 2021-12-30 NOTE — Telephone Encounter (Signed)
Rx filled for patient per Dr. Katrinka Blazing.

## 2021-12-30 NOTE — Telephone Encounter (Signed)
Dosage info:  Gabapentin 400 mg, 1x at night Cymbalta  20 mg, 1x at night Mobic 15mg , 1 x in morning   Requesting 90 day refills when possible. [facility] Interested in increase of Cymbalta

## 2022-01-25 NOTE — Progress Notes (Deleted)
[facility] [address] [place] [address] Phone: [phone] Subjective:    I'm seeing this patient by the request  of:  Donato Schultz, DO  CC: left wrist   [id number]  12/10/2021 Patient has had difficulty with polyarthralgia over the course of time.  Discussed with patient that we could consider a laboratory work-up.  Has had a positive ANA previously.  In addition to this though fibromyalgia is within the differential.  Patient has elected to try Cymbalta and see how patient responds.  Patient does not have insurance at this time and is concerned of the cost of the laboratory work-up.  Patient can follow-up with me again in 2 months otherwise.  Patient hopefully will be doing much better at this time.  Chronic problem with worsening symptoms.  Patient given a name of a hand surgeon for the possibility of intervention as well.  We discussed with patient that we can continue the injection but we do need to watch for the thenar eminence wasting.  Patient will continue to do bracing at night.  We will start on Cymbalta.  Increase patient's gabapentin to 400 mg and see how patient responds.  Warned of potential side effects.  We also discussed labs which patient wants to hold at this point  Update 01/26/2022 Cindy Stephens is a 56 y.o. female coming in with complaint of L carpal tunnel. Patient states        Past Medical History:  Diagnosis Date   Allergic rhinitis    Depression    GERD (gastroesophageal reflux disease)    Shoulder dislocation 2007   Left Shoulder   Urinary incontinence    Past Surgical History:  Procedure Laterality Date   APPENDECTOMY     ELBOW SURGERY Left 2007   elbow was shattered   Social History   Socioeconomic History   Marital status: Married    Spouse name: Not on file   Number of children: Not on file   Years of education: Not on file   Highest education level: Not on file  Occupational History    Not on file  Tobacco Use   Smoking status: Never   Smokeless tobacco: Never  Substance and Sexual Activity   Alcohol use: Yes    Alcohol/week: 0.0 standard drinks of alcohol    Comment: Occ   Drug use: No   Sexual activity: Yes    Partners: Male  Other Topics Concern   Not on file  Social History Narrative   Lives with husband and children in a 2 story home.  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  Education: BS   Social Determinants of Corporate investment banker Strain: Not on file  Food Insecurity: Not on file  Transportation Needs: Not on file  Physical Activity: Not on file  Stress: Not on file  Social Connections: Not on file   Allergies  Allergen Reactions   Sulfa Antibiotics Hives and Nausea Only   Family History  Problem Relation Age of Onset   Arthritis Father    Hyperlipidemia Father    Alzheimer's disease Father    Hypertension Father        Paternal Family   Arthritis Mother    Arthritis Other    Osteoarthritis Brother    Arthritis Sister    Diabetes Paternal Grandmother        PGGM   Heart disease Other        Pateternal Family  Stroke Other        Paternal Family      Current Outpatient Medications (Respiratory):    albuterol (VENTOLIN HFA) 108 (90 Base) MCG/ACT inhaler, Inhale 2 puffs into the lungs every 6 (six) hours as needed for wheezing or shortness of breath.   fluticasone (FLONASE) 50 MCG/ACT nasal spray, Place 2 sprays into both nostrils daily.   loratadine (CLARITIN) 10 MG tablet, Take 10 mg by mouth daily.   montelukast (SINGULAIR) 10 MG tablet, TAKE ONE TABLET BY MOUTH DAILY AT BEDTIME    pseudoephedrine (SUDAFED) 30 MG tablet, Take 30 mg by mouth every 4 (four) hours as needed for congestion.  Current Outpatient Medications (Analgesics):    aspirin 81 MG tablet, Take 81 mg by mouth daily.   meloxicam (MOBIC) 15 MG tablet, Take 1 tablet (15 mg total) by mouth daily.  Current Outpatient Medications (Hematological):    vitamin B-12  (CYANOCOBALAMIN) 1000 MCG tablet, Take 1,000 mcg by mouth daily.  Current Outpatient Medications (Other):    buPROPion (WELLBUTRIN XL) 300 MG 24 hr tablet, Take 1 tablet (300 mg total) by mouth daily.   co-enzyme Q-10 30 MG capsule, Take 30 mg by mouth 3 (three) times daily.   Collagen Hydrolysate, Bovine, POWD, by Does not apply route.   DHEA 10 MG TABS, Take by mouth.   diazepam (VALIUM) 5 MG tablet, Take 1 tablet (5 mg total) by mouth 3 (three) times daily as needed for anxiety.   Diclofenac Sodium 2 % SOLN, Apply twice daily.   DULoxetine (CYMBALTA) 20 MG capsule, Take 1 capsule (20 mg total) by mouth daily.   gabapentin (NEURONTIN) 400 MG capsule, Take 1 capsule (400 mg total) by mouth at bedtime.   glucosamine-chondroitin 500-400 MG tablet, Take 1 tablet by mouth 2 (two) times daily.   lidocaine (XYLOCAINE) 5 % ointment, Apply 1 application topically 3 (three) times daily as needed. Use gloves when applying to left leg.   magnesium oxide (MAG-OX) 400 MG tablet, Take 400 mg by mouth daily.   [place] Mushroom POWD, by Does not apply route.   methylphenidate (RITALIN) 20 MG tablet, Take 1 tablet (20 mg total) by mouth 2 (two) times daily.   methylphenidate (RITALIN) 20 MG tablet, Take 1 tablet (20 mg total) by mouth 2 (two) times daily.   methylphenidate (RITALIN) 20 MG tablet, Take 1 tablet (20 mg total) by mouth 2 (two) times daily.   Misc Natural Products (PROGESTERONE EX), Apply topically daily.   NIACIN CR PO, Take by mouth.   NONFORMULARY OR COMPOUNDED ITEM, Lipid, hep-- hyperlipidemia   NONFORMULARY OR COMPOUNDED ITEM, Cbcd, cmp, lipid, tsh----- dx complete physical exam   NONFORMULARY OR COMPOUNDED ITEM, Free t3 Free t4 Dx preventative health exam   Omega-3 Fatty Acids (FISH OIL) 1000 MG CAPS, Take 1 capsule by mouth.   omeprazole (PRILOSEC OTC) 20 MG tablet, Take 20 mg by mouth daily.   Potassium 99 MG TABS, Take by mouth.   Probiotic Product (PROBIOTIC DAILY PO), Take by  mouth.   tazarotene (TAZORAC) 0.05 % cream, Apply topically at bedtime.   Turmeric 500 MG CAPS, Take by mouth.   valACYclovir (VALTREX) 1000 MG tablet, 1 po qd   zolpidem (AMBIEN) 5 MG tablet, Take 1 tablet (5 mg total) by mouth at bedtime as needed for sleep.   Reviewed prior external information including notes and imaging from  primary care provider As well as notes that were available from care everywhere and other healthcare systems.  Past medical history, social, surgical and family history all reviewed in electronic medical record.  No pertanent information unless stated regarding to the chief complaint.   Review of Systems:  No headache, visual changes, nausea, vomiting, diarrhea, constipation, dizziness, abdominal pain, skin rash, fevers, chills, night sweats, weight loss, swollen lymph nodes, body aches, joint swelling, chest pain, shortness of breath, mood changes. POSITIVE muscle aches  Objective  Last menstrual period 06/13/2016.   General: No apparent distress alert and oriented x3 mood and affect normal, dressed appropriately.  HEENT: Pupils equal, extraocular movements intact  Respiratory: Patient's speak in full sentences and does not appear short of breath  Cardiovascular: No lower extremity edema, non tender, no erythema      Impression and Recommendations:

## 2022-01-26 ENCOUNTER — Ambulatory Visit: Payer: Self-pay | Admitting: Family Medicine

## 2022-03-30 ENCOUNTER — Other Ambulatory Visit: Payer: Self-pay

## 2022-03-30 ENCOUNTER — Telehealth: Payer: Self-pay | Admitting: Family Medicine

## 2022-03-30 MED ORDER — DULOXETINE HCL 20 MG PO CPEP: 20.0000 mg | ORAL_CAPSULE | Freq: Every day | ORAL | 0 refills | Status: DC

## 2022-03-30 MED ORDER — MELOXICAM 15 MG PO TABS: 15.0000 mg | ORAL_TABLET | Freq: Every day | ORAL | 0 refills | Status: DC

## 2022-03-30 MED ORDER — GABAPENTIN 400 MG PO CAPS: 400.0000 mg | ORAL_CAPSULE | Freq: Every day | ORAL | 0 refills | Status: DC

## 2022-03-30 NOTE — Telephone Encounter (Signed)
Rx filled per a verbal from Dr. Smith.  

## 2022-03-30 NOTE — Telephone Encounter (Signed)
Pt would like 90 day refills of Gabapentin and Cymbalta to [facility].

## 2022-07-02 ENCOUNTER — Other Ambulatory Visit: Payer: Self-pay | Admitting: Family Medicine

## 2022-07-05 NOTE — Telephone Encounter (Signed)
Patient called and left a message Friday evening asking for these refills as well.

## 2022-07-06 ENCOUNTER — Other Ambulatory Visit: Payer: Self-pay

## 2022-07-06 MED ORDER — GABAPENTIN 400 MG PO CAPS: 400.0000 mg | ORAL_CAPSULE | Freq: Every day | ORAL | 0 refills | Status: DC

## 2022-07-06 MED ORDER — MELOXICAM 15 MG PO TABS: 15.0000 mg | ORAL_TABLET | Freq: Every day | ORAL | 0 refills | Status: DC

## 2022-07-06 MED ORDER — DULOXETINE HCL 20 MG PO CPEP: 20.0000 mg | ORAL_CAPSULE | Freq: Every day | ORAL | 0 refills | Status: DC

## 2022-08-03 ENCOUNTER — Telehealth: Payer: Self-pay | Admitting: Family Medicine

## 2022-08-03 ENCOUNTER — Other Ambulatory Visit: Payer: Self-pay | Admitting: Family Medicine

## 2022-08-03 DIAGNOSIS — F321 Major depressive disorder, single episode, moderate: Secondary | ICD-10-CM

## 2022-08-03 MED ORDER — BUPROPION HCL ER (XL) 300 MG PO TB24: 300.0000 mg | ORAL_TABLET | Freq: Every day | ORAL | 0 refills | Status: DC

## 2022-08-03 NOTE — Telephone Encounter (Signed)
Pt scheduled physical for 08/24/22 but wanted to know if Dr. Etter Sjogren would be willing to call in a refill of her wellbutrin xl to [facility] on [address] as she feels she needs to get back on it. It has been a while since she has seen Dr. Etter Sjogren because she does not have insurance. Please call her either way.

## 2022-08-03 NOTE — Telephone Encounter (Signed)
Pt hasn't been since 01/18/2019

## 2022-08-24 ENCOUNTER — Ambulatory Visit (INDEPENDENT_AMBULATORY_CARE_PROVIDER_SITE_OTHER): Payer: Self-pay | Admitting: Family Medicine

## 2022-08-24 ENCOUNTER — Encounter: Payer: Self-pay | Admitting: Family Medicine

## 2022-08-24 VITALS — BP 120/80 | HR 95 | Temp 97.8°F | Resp 18 | Ht 63.0 in | Wt 191.0 lb

## 2022-08-24 DIAGNOSIS — F32A Depression, unspecified: Secondary | ICD-10-CM

## 2022-08-24 DIAGNOSIS — R32 Unspecified urinary incontinence: Secondary | ICD-10-CM

## 2022-08-24 DIAGNOSIS — Z8619 Personal history of other infectious and parasitic diseases: Secondary | ICD-10-CM

## 2022-08-24 DIAGNOSIS — G4700 Insomnia, unspecified: Secondary | ICD-10-CM

## 2022-08-24 DIAGNOSIS — F40243 Fear of flying: Secondary | ICD-10-CM

## 2022-08-24 DIAGNOSIS — F321 Major depressive disorder, single episode, moderate: Secondary | ICD-10-CM

## 2022-08-24 DIAGNOSIS — R631 Polydipsia: Secondary | ICD-10-CM

## 2022-08-24 DIAGNOSIS — J45909 Unspecified asthma, uncomplicated: Secondary | ICD-10-CM

## 2022-08-24 DIAGNOSIS — F988 Other specified behavioral and emotional disorders with onset usually occurring in childhood and adolescence: Secondary | ICD-10-CM

## 2022-08-24 DIAGNOSIS — Z789 Other specified health status: Secondary | ICD-10-CM

## 2022-08-24 DIAGNOSIS — Z79899 Other long term (current) drug therapy: Secondary | ICD-10-CM

## 2022-08-24 DIAGNOSIS — Z8659 Personal history of other mental and behavioral disorders: Secondary | ICD-10-CM | POA: Insufficient documentation

## 2022-08-24 DIAGNOSIS — Z0000 Encounter for general adult medical examination without abnormal findings: Secondary | ICD-10-CM

## 2022-08-24 LAB — POC URINALSYSI DIPSTICK (AUTOMATED)
Blood, UA: NEGATIVE
Glucose, UA: NEGATIVE
Ketones, UA: NEGATIVE
Leukocytes, UA: NEGATIVE
Nitrite, UA: NEGATIVE
Protein, UA: NEGATIVE
Spec Grav, UA: 1.015 (ref 1.010–1.025)
Urobilinogen, UA: 0.2 E.U./dL
pH, UA: 7.5 (ref 5.0–8.0)

## 2022-08-24 MED ORDER — AMPHETAMINE-DEXTROAMPHETAMINE 20 MG PO TABS: 20.0000 mg | ORAL_TABLET | Freq: Two times a day (BID) | ORAL | 0 refills | Status: DC

## 2022-08-24 MED ORDER — TYROSINE 500 MG PO CAPS: ORAL_CAPSULE | ORAL | 0 refills | Status: AC

## 2022-08-24 MED ORDER — DIAZEPAM 5 MG PO TABS: 5.0000 mg | ORAL_TABLET | Freq: Three times a day (TID) | ORAL | 0 refills | Status: AC | PRN

## 2022-08-24 MED ORDER — ALBUTEROL SULFATE HFA 108 (90 BASE) MCG/ACT IN AERS: 2.0000 | INHALATION_SPRAY | Freq: Four times a day (QID) | RESPIRATORY_TRACT | 0 refills | Status: DC | PRN

## 2022-08-24 MED ORDER — ZOLPIDEM TARTRATE 5 MG PO TABS: 5.0000 mg | ORAL_TABLET | Freq: Every evening | ORAL | 1 refills | Status: DC | PRN

## 2022-08-24 MED ORDER — VALACYCLOVIR HCL 1 G PO TABS: ORAL_TABLET | ORAL | 2 refills | Status: AC

## 2022-08-24 MED ORDER — MONTELUKAST SODIUM 10 MG PO TABS: 10.0000 mg | ORAL_TABLET | Freq: Every day | ORAL | 1 refills | Status: DC

## 2022-08-24 MED ORDER — METHYLPHENIDATE HCL 20 MG PO TABS: 20.0000 mg | ORAL_TABLET | Freq: Two times a day (BID) | ORAL | 0 refills | Status: DC

## 2022-08-24 NOTE — Assessment & Plan Note (Signed)
stable °

## 2022-08-24 NOTE — Progress Notes (Signed)
Subjective:     Cindy Stephens is a 57 y.o. female and is here for a comprehensive physical exam. The patient reports problems - pt has c/o hair loss, urinary incontinence .  Social History   Socioeconomic History   Marital status: Married    Spouse name: Not on file   Number of children: Not on file   Years of education: Not on file   Highest education level: Not on file  Occupational History   Not on file  Tobacco Use   Smoking status: Never   Smokeless tobacco: Never  Substance and Sexual Activity   Alcohol use: Yes    Alcohol/week: 0.0 standard drinks of alcohol    Comment: Occ   Drug use: No   Sexual activity: Yes    Partners: Male  Other Topics Concern   Not on file  Social History Narrative   Lives with husband and children in a 2 story home.  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  Education: BS   Social Determinants of Health   Financial Resource Strain: Not on file  Food Insecurity: No Food Insecurity (08/24/2022)   Hunger Vital Sign    Worried About Running Out of Food in the Last Year: Never true    Ran Out of Food in the Last Year: Never true  Transportation Needs: Not on file  Physical Activity: Inactive (08/24/2022)   Exercise Vital Sign    Days of Exercise per Week: 0 days    Minutes of Exercise per Session: 0 min  Stress: Not on file  Social Connections: Not on file  Intimate Partner Violence: Not on file   Health Maintenance  Topic Date Due   HIV Screening  Never done   Hepatitis C Screening  Never done   COLONOSCOPY (Pts 45-52yr Insurance coverage will need to be confirmed)  Never done   DTaP/Tdap/Td (2 - Td or Tdap) 06/14/2014   MAMMOGRAM  12/26/2014   PAP SMEAR-Modifier  01/17/2022   INFLUENZA VACCINE  Completed   COVID-19 Vaccine  Completed   Zoster Vaccines- Shingrix  Completed   HPV VACCINES  Aged Out    The following portions of the patient's history were reviewed and updated as appropriate: She  has a past medical history of Allergic  rhinitis, Depression, GERD (gastroesophageal reflux disease), Shoulder dislocation (2007), and Urinary incontinence. She does not have any pertinent problems on file. She  has a past surgical history that includes Elbow surgery (Left, 2007) and Appendectomy. Her family history includes Alzheimer's disease in her father; Arthritis in her father, mother, sister, and another family member; Diabetes in her paternal grandmother; Heart disease in her mother and another family member; Hyperlipidemia in her father; Hypertension in her father; Osteoarthritis in her brother; Stroke in an other family member; Thyroid disease in her mother. She  reports that she has never smoked. She has never used smokeless tobacco. She reports current alcohol use. She reports that she does not use drugs. She has a current medication list which includes the following prescription(s): amphetamine-dextroamphetamine, bupropion, co-enzyme q-10, collagen hydrolysate (bovine), diclofenac sodium, duloxetine, fluticasone, gabapentin, glucosamine-chondroitin, lidocaine, loratadine, magnesium oxide, maitake mushroom, meloxicam, methylphenidate, misc natural products, niacin, NONFORMULARY OR COMPOUNDED ITEM, NONFORMULARY OR COMPOUNDED ITEM, NONFORMULARY OR COMPOUNDED ITEM, fish oil, pseudoephedrine, tazarotene, turmeric, tyrosine, cyanocobalamin, albuterol, diazepam, montelukast, valacyclovir, and zolpidem. Current Outpatient Medications on File Prior to Visit  Medication Sig Dispense Refill   buPROPion (WELLBUTRIN XL) 300 MG 24 hr tablet Take 1 tablet (300 mg total)  by mouth daily. 90 tablet 0   co-enzyme Q-10 30 MG capsule Take 30 mg by mouth 3 (three) times daily.     Collagen Hydrolysate, Bovine, POWD by Does not apply route.     Diclofenac Sodium 2 % SOLN Apply twice daily. 112 g 1   DULoxetine (CYMBALTA) 20 MG capsule Take 1 capsule (20 mg total) by mouth daily. 90 capsule 0   fluticasone (FLONASE) 50 MCG/ACT nasal spray Place 2 sprays  into both nostrils daily. 16 g 2   gabapentin (NEURONTIN) 400 MG capsule Take 1 capsule (400 mg total) by mouth at bedtime. 90 capsule 0   glucosamine-chondroitin 500-400 MG tablet Take 1 tablet by mouth 2 (two) times daily.     lidocaine (XYLOCAINE) 5 % ointment Apply 1 application topically 3 (three) times daily as needed. Use gloves when applying to left leg. 35.44 g 3   loratadine (CLARITIN) 10 MG tablet Take 10 mg by mouth daily.     magnesium oxide (MAG-OX) 400 MG tablet Take 400 mg by mouth daily.     Maitake Mushroom POWD by Does not apply route.     meloxicam (MOBIC) 15 MG tablet Take 1 tablet (15 mg total) by mouth daily. 90 tablet 0   methylphenidate (RITALIN) 20 MG tablet Take 1 tablet (20 mg total) by mouth 2 (two) times daily. 60 tablet 0   Misc Natural Products (PROGESTERONE EX) Apply topically daily.     NIACIN CR PO Take by mouth.     NONFORMULARY OR COMPOUNDED ITEM Lipid, hep-- hyperlipidemia 1 each 0   NONFORMULARY OR COMPOUNDED ITEM Cbcd, cmp, lipid, tsh----- dx complete physical exam 1 each 0   NONFORMULARY OR COMPOUNDED ITEM Free t3 Free t4 Dx preventative health exam 1 each 0   Omega-3 Fatty Acids (FISH OIL) 1000 MG CAPS Take 1 capsule by mouth.     pseudoephedrine (SUDAFED) 30 MG tablet Take 30 mg by mouth every 4 (four) hours as needed for congestion.     tazarotene (TAZORAC) 0.05 % cream Apply topically at bedtime. 30 g 5   Turmeric 500 MG CAPS Take by mouth.     vitamin B-12 (CYANOCOBALAMIN) 1000 MCG tablet Take 1,000 mcg by mouth daily.     No current facility-administered medications on file prior to visit.   She is allergic to sulfa antibiotics..  Review of Systems Review of Systems  Constitutional: Negative for activity change, appetite change and fatigue.  HENT: Negative for hearing loss, congestion, tinnitus and ear discharge.  dentist q18mEyes: Negative for visual disturbance (see optho q1y -- vision corrected to 20/20 with glasses).  Respiratory:  Negative for cough, chest tightness and shortness of breath.   Cardiovascular: Negative for chest pain, palpitations and leg swelling.  Gastrointestinal: Negative for abdominal pain, diarrhea, constipation and abdominal distention.  Genitourinary: Negative for urgency, frequency, decreased urine volume and difficulty urinating.  Musculoskeletal: Negative for back pain, arthralgias and gait problem.  Skin: Negative for color change, pallor and rash.  Neurological: Negative for dizziness, light-headedness, numbness and headaches.  Hematological: Negative for adenopathy. Does not bruise/bleed easily.  Psychiatric/Behavioral: Negative for suicidal ideas, confusion, sleep disturbance, self-injury, dysphoric mood, decreased concentration and agitation.      Objective:    BP 120/80 (BP Location: Left Arm, Patient Position: Sitting, Cuff Size: Large)   Pulse 95   Temp 97.8 F (36.6 C) (Oral)   Resp 18   Ht '5\' 3"'$  (1.6 m)   Wt 191 lb (86.6 kg)  LMP 06/13/2016   SpO2 96%   BMI 33.83 kg/m  General appearance: alert, cooperative, appears stated age, and no distress Head: Normocephalic, without obvious abnormality, atraumatic Eyes: conjunctivae/corneas clear. PERRL, EOM's intact. Fundi benign. Ears: normal TM's and external ear canals both ears Nose: Nares normal. Septum midline. Mucosa normal. No drainage or sinus tenderness. Throat: lips, mucosa, and tongue normal; teeth and gums normal Neck: no adenopathy, no carotid bruit, no JVD, supple, symmetrical, trachea midline, and thyroid not enlarged, symmetric, no tenderness/mass/nodules Back: symmetric, no curvature. ROM normal. No CVA tenderness. Lungs: clear to auscultation bilaterally Heart: regular rate and rhythm, S1, S2 normal, no murmur, click, rub or gallop Abdomen: soft, non-tender; bowel sounds normal; no masses,  no organomegaly Extremities: extremities normal, atraumatic, no cyanosis or edema Pulses: 2+ and symmetric Skin: Skin  color, texture, turgor normal. No rashes or lesions Lymph nodes: Cervical, supraclavicular, and axillary nodes normal. Neurologic: Alert and oriented X 3, normal strength and tone. Normal symmetric reflexes. Normal coordination and gait    Assessment:    Healthy female exam.      Plan:    Ghm utd Check labs  See After Visit Summary for Counseling Recommendations  . 1. Insomnia, unspecified type Stable  Refill meds  - zolpidem (AMBIEN) 5 MG tablet; Take 1 tablet (5 mg total) by mouth at bedtime as needed for sleep.  Dispense: 30 tablet; Refill: 1  2. Hx of cold sores   - valACYclovir (VALTREX) 1000 MG tablet; 1 po qd  Dispense: 30 tablet; Refill: 2  3. Allergy history unknown   - albuterol (VENTOLIN HFA) 108 (90 Base) MCG/ACT inhaler; Inhale 2 puffs into the lungs every 6 (six) hours as needed for wheezing or shortness of breath.  Dispense: 8 g; Refill: 0 - montelukast (SINGULAIR) 10 MG tablet; Take 1 tablet (10 mg total) by mouth at bedtime.  Dispense: 90 tablet; Refill: 1  4. Fear of flying   - diazepam (VALIUM) 5 MG tablet; Take 1 tablet (5 mg total) by mouth 3 (three) times daily as needed for anxiety.  Dispense: 30 tablet; Refill: 0  5. Preventative health care See above Pap deferred - CBC with Differential/Platelet - Comprehensive metabolic panel - Lipid panel - TSH  6. Asthma due to environmental allergies   - albuterol (VENTOLIN HFA) 108 (90 Base) MCG/ACT inhaler; Inhale 2 puffs into the lungs every 6 (six) hours as needed for wheezing or shortness of breath.  Dispense: 8 g; Refill: 0  7. Polydipsia Check labs  - Comprehensive metabolic panel - Hemoglobin A1c  8. History of attention deficit disorder  - Ambulatory referral to Psychology  9. Attention deficit disorder, unspecified hyperactivity presence Adderall 20 mg bid - Tyrosine 500 MG CAPS; As directed; Refill: 0 - amphetamine-dextroamphetamine (ADDERALL) 20 MG tablet; Take 1 tablet (20 mg total)  by mouth 2 (two) times daily.  Dispense: 60 tablet; Refill: 0  10. Urinary incontinence, unspecified type   - POCT Urinalysis Dipstick (Automated)  11. High risk medication use   - Drug Monitoring Panel [id number] , Urine  12. 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 Depression, unspecified depression type

## 2022-08-24 NOTE — Assessment & Plan Note (Signed)
Stable ---- on cymbalta and valium

## 2022-08-24 NOTE — Assessment & Plan Note (Signed)
Adderrall 20 mg bid  Uds / contract updated  Database reviewed

## 2022-08-24 NOTE — Patient Instructions (Signed)
Preventive Care 40-58 Years Old, Female Preventive care refers to lifestyle choices and visits with your health care provider that can promote health and wellness. Preventive care visits are also called wellness exams. What can I expect for my preventive care visit? Counseling Your health care provider may ask you questions about your: Medical history, including: Past medical problems. Family medical history. Pregnancy history. Current health, including: Menstrual cycle. Method of birth control. Emotional well-being. Home life and relationship well-being. Sexual activity and sexual health. Lifestyle, including: Alcohol, nicotine or tobacco, and drug use. Access to firearms. Diet, exercise, and sleep habits. Work and work environment. Sunscreen use. Safety issues such as seatbelt and bike helmet use. Physical exam Your health care provider will check your: Height and weight. These may be used to calculate your BMI (body mass index). BMI is a measurement that tells if you are at a healthy weight. Waist circumference. This measures the distance around your waistline. This measurement also tells if you are at a healthy weight and may help predict your risk of certain diseases, such as type 2 diabetes and high blood pressure. Heart rate and blood pressure. Body temperature. Skin for abnormal spots. What immunizations do I need?  Vaccines are usually given at various ages, according to a schedule. Your health care provider will recommend vaccines for you based on your age, medical history, and lifestyle or other factors, such as travel or where you work. What tests do I need? Screening Your health care provider may recommend screening tests for certain conditions. This may include: Lipid and cholesterol levels. Diabetes screening. This is done by checking your blood sugar (glucose) after you have not eaten for a while (fasting). Pelvic exam and Pap test. Hepatitis B test. Hepatitis C  test. HIV (human immunodeficiency virus) test. STI (sexually transmitted infection) testing, if you are at risk. Lung cancer screening. Colorectal cancer screening. Mammogram. Talk with your health care provider about when you should start having regular mammograms. This may depend on whether you have a family history of breast cancer. BRCA-related cancer screening. This may be done if you have a family history of breast, ovarian, tubal, or peritoneal cancers. Bone density scan. This is done to screen for osteoporosis. Talk with your health care provider about your test results, treatment options, and if necessary, the need for more tests. Follow these instructions at home: Eating and drinking  Eat a diet that includes fresh fruits and vegetables, whole grains, lean protein, and low-fat dairy products. Take vitamin and mineral supplements as recommended by your health care provider. Do not drink alcohol if: Your health care provider tells you not to drink. You are pregnant, may be pregnant, or are planning to become pregnant. If you drink alcohol: Limit how much you have to 0-1 drink a day. Know how much alcohol is in your drink. In the [place] one drink equals one 12 oz bottle of beer (355 mL), one 5 oz glass of wine (148 mL), or one 1 oz glass of hard liquor (44 mL). Lifestyle Brush your teeth every morning and night with fluoride toothpaste. Floss one time each day. Exercise for at least 30 minutes 5 or more days each week. Do not use any products that contain nicotine or tobacco. These products include cigarettes, chewing tobacco, and vaping devices, such as e-cigarettes. If you need help quitting, ask your health care provider. Do not use drugs. If you are sexually active, practice safe sex. Use a condom or other form of protection to   prevent STIs. If you do not wish to become pregnant, use a form of birth control. If you plan to become pregnant, see your health care provider for a  prepregnancy visit. Take aspirin only as told by your health care provider. Make sure that you understand how much to take and what form to take. Work with your health care provider to find out whether it is safe and beneficial for you to take aspirin daily. Find healthy ways to manage stress, such as: Meditation, yoga, or listening to music. Journaling. Talking to a trusted person. Spending time with friends and family. Minimize exposure to UV radiation to reduce your risk of skin cancer. Safety Always wear your seat belt while driving or riding in a vehicle. Do not drive: If you have been drinking alcohol. Do not ride with someone who has been drinking. When you are tired or distracted. While texting. If you have been using any mind-altering substances or drugs. Wear a helmet and other protective equipment during sports activities. If you have firearms in your house, make sure you follow all gun safety procedures. Seek help if you have been physically or sexually abused. What's next? Visit your health care provider once a year for an annual wellness visit. Ask your health care provider how often you should have your eyes and teeth checked. Stay up to date on all vaccines. This information is not intended to replace advice given to you by your health care provider. Make sure you discuss any questions you have with your health care provider. Document Revised: 11/26/2020 Document Reviewed: 11/26/2020 Elsevier Patient Education  2023 Elsevier Inc.  

## 2022-08-24 NOTE — Progress Notes (Signed)
Pt as b/l knee pain --- has no ins so does not want referral--  but has seen Dr Tamala Julian in past

## 2022-08-25 LAB — COMPREHENSIVE METABOLIC PANEL
ALT: 31 U/L (ref 0–35)
AST: 22 U/L (ref 0–37)
Albumin: 4.4 g/dL (ref 3.5–5.2)
Alkaline Phosphatase: 87 U/L (ref 39–117)
BUN: 22 mg/dL (ref 6–23)
CO2: 32 mEq/L (ref 19–32)
Calcium: 10.1 mg/dL (ref 8.4–10.5)
Chloride: 101 mEq/L (ref 96–112)
Creatinine, Ser: 0.7 mg/dL (ref 0.40–1.20)
GFR: 95.84 mL/min (ref >60.00)
Glucose, Bld: 85 mg/dL (ref 70–99)
Potassium: 4.7 mEq/L (ref 3.5–5.1)
Sodium: 140 mEq/L (ref 135–145)
Total Bilirubin: 0.4 mg/dL (ref 0.2–1.2)
Total Protein: 7.2 g/dL (ref 6.0–8.3)

## 2022-08-25 LAB — LIPID PANEL
Cholesterol: 236 mg/dL — ABNORMAL HIGH (ref 0–200)
HDL: 63.8 mg/dL (ref >39.00)
NonHDL: 172.23
Total CHOL/HDL Ratio: 4
Triglycerides: 232 mg/dL — ABNORMAL HIGH (ref 0.0–149.0)
VLDL: 46.4 mg/dL — ABNORMAL HIGH (ref 0.0–40.0)

## 2022-08-25 LAB — CBC WITH DIFFERENTIAL/PLATELET
Basophils Absolute: 0 10*3/uL (ref 0.0–0.1)
Basophils Relative: 0.9 % (ref 0.0–3.0)
Eosinophils Absolute: 0.1 10*3/uL (ref 0.0–0.7)
Eosinophils Relative: 2.5 % (ref 0.0–5.0)
HCT: 42.5 % (ref 36.0–46.0)
Hemoglobin: 14.4 g/dL (ref 12.0–15.0)
Lymphocytes Relative: 38.8 % (ref 12.0–46.0)
Lymphs Abs: 1.7 10*3/uL (ref 0.7–4.0)
MCHC: 33.8 g/dL (ref 30.0–36.0)
MCV: 88.6 fl (ref 78.0–100.0)
Monocytes Absolute: 0.3 10*3/uL (ref 0.1–1.0)
Monocytes Relative: 7.5 % (ref 3.0–12.0)
Neutro Abs: 2.2 10*3/uL (ref 1.4–7.7)
Neutrophils Relative %: 50.3 % (ref 43.0–77.0)
Platelets: 231 10*3/uL (ref 150.0–400.0)
RBC: 4.8 Mil/uL (ref 3.87–5.11)
RDW: 13.5 % (ref 11.5–15.5)
WBC: 4.5 10*3/uL (ref 4.0–10.5)

## 2022-08-25 LAB — HEMOGLOBIN A1C: Hgb A1c MFr Bld: 5.5 % (ref 4.6–6.5)

## 2022-08-25 LAB — LDL CHOLESTEROL, DIRECT: Direct LDL: 132 mg/dL

## 2022-08-25 LAB — TSH: TSH: 2.61 u[IU]/mL (ref 0.35–5.50)

## 2022-08-26 LAB — DRUG MONITORING PANEL 376104, URINE
Amphetamines: NEGATIVE ng/mL (ref <500)
Barbiturates: NEGATIVE ng/mL (ref <300)
Benzodiazepines: NEGATIVE ng/mL (ref <100)
Cocaine Metabolite: NEGATIVE ng/mL (ref <150)
Desmethyltramadol: NEGATIVE ng/mL (ref <100)
Opiates: NEGATIVE ng/mL (ref <100)
Oxycodone: NEGATIVE ng/mL (ref <100)
Tramadol: NEGATIVE ng/mL (ref <100)

## 2022-08-26 LAB — DM TEMPLATE

## 2022-09-13 ENCOUNTER — Other Ambulatory Visit: Payer: Self-pay | Admitting: Family Medicine

## 2022-09-13 ENCOUNTER — Telehealth: Payer: Self-pay | Admitting: Family Medicine

## 2022-09-13 DIAGNOSIS — F988 Other specified behavioral and emotional disorders with onset usually occurring in childhood and adolescence: Secondary | ICD-10-CM

## 2022-09-13 MED ORDER — METHYLPHENIDATE HCL 10 MG PO TABS: 10.0000 mg | ORAL_TABLET | Freq: Two times a day (BID) | ORAL | 0 refills | Status: DC

## 2022-09-13 NOTE — Telephone Encounter (Signed)
Patient states that the adderral is making her feel awful, the side effects are extreme sleepiness and dry mouth. She would like to know if Ritallin can be sent in to her pharmacy. Please advise.

## 2022-09-30 ENCOUNTER — Telehealth: Payer: Self-pay | Admitting: Family Medicine

## 2022-09-30 ENCOUNTER — Other Ambulatory Visit: Payer: Self-pay | Admitting: Family Medicine

## 2022-09-30 DIAGNOSIS — F988 Other specified behavioral and emotional disorders with onset usually occurring in childhood and adolescence: Secondary | ICD-10-CM

## 2022-09-30 DIAGNOSIS — F321 Major depressive disorder, single episode, moderate: Secondary | ICD-10-CM

## 2022-09-30 MED ORDER — METHYLPHENIDATE HCL 20 MG PO TABS: 20.0000 mg | ORAL_TABLET | Freq: Two times a day (BID) | ORAL | 0 refills | Status: DC

## 2022-09-30 NOTE — Telephone Encounter (Signed)
Patient called stating she used to take s of Ritalin, but since they changed it to adderall and then back to Ritalin the prescription was changed. She would like to know if she is supposed to stay on the 10 mg or go back to the 20 mg.

## 2022-10-01 ENCOUNTER — Other Ambulatory Visit: Payer: Self-pay | Admitting: Family Medicine

## 2022-10-05 ENCOUNTER — Other Ambulatory Visit: Payer: Self-pay | Admitting: Family Medicine

## 2022-10-05 ENCOUNTER — Telehealth: Payer: Self-pay | Admitting: Family Medicine

## 2022-10-05 DIAGNOSIS — F988 Other specified behavioral and emotional disorders with onset usually occurring in childhood and adolescence: Secondary | ICD-10-CM

## 2022-10-05 MED ORDER — METHYLPHENIDATE HCL 10 MG PO TABS
ORAL_TABLET | ORAL | 0 refills | Status: DC
Start: 2022-10-05 — End: 2022-11-12

## 2022-10-05 MED ORDER — BUPROPION HCL ER (XL) 300 MG PO TB24
300.0000 mg | ORAL_TABLET | Freq: Every day | ORAL | 1 refills | Status: DC
Start: 2022-10-05 — End: 2023-03-28

## 2022-10-05 NOTE — Telephone Encounter (Signed)
Pt.notified

## 2022-10-05 NOTE — Telephone Encounter (Signed)
Per patient, [facility] cannot fill her Methylphenidate for the scrip they have now but they will fill it for 10 mg to fill 120 quantity. Which will still 40 mg a day. She stated they need it ASAP due to them not being able to hold the amount they have if someone else needs a prescription. Please advise.

## 2022-10-05 NOTE — Telephone Encounter (Signed)
Patient notified.  She stated that she is doing better on the Ritalin.  Also she needed a refill for wellbutrin. Rx sent in.

## 2022-10-05 NOTE — Addendum Note (Signed)
Addended by: Thelma Barge D on: 10/05/2022 11:52 AM   Modules accepted: Orders

## 2022-11-12 ENCOUNTER — Other Ambulatory Visit: Payer: Self-pay | Admitting: Family Medicine

## 2022-11-12 DIAGNOSIS — F988 Other specified behavioral and emotional disorders with onset usually occurring in childhood and adolescence: Secondary | ICD-10-CM

## 2022-11-12 MED ORDER — METHYLPHENIDATE HCL 10 MG PO TABS
ORAL_TABLET | ORAL | 0 refills | Status: DC
Start: 2022-11-12 — End: 2022-12-24

## 2022-11-12 NOTE — Telephone Encounter (Signed)
Requesting: methylphenidate 10mg  Contract: Yes UDS: 08/24/2022 Last Visit: 08/24/2022 Next Visit: Visit date not found Last Refill: 10/05/22  Please Advise

## 2022-11-12 NOTE — Telephone Encounter (Signed)
Patient is requesting a refill of the following medications: Requested Prescriptions   Pending Prescriptions Disp Refills   methylphenidate (RITALIN) 10 MG tablet 120 tablet 0    Sig: 2 po bid    Date of patient request: 11/12/22 Last office visit: 08/24/22 Date of last refill: 10/05/22 Last refill amount: 120

## 2022-11-12 NOTE — Telephone Encounter (Signed)
Prescription Request  11/12/2022  Is this a "Controlled Substance" medicine? Yes  LOV: 08/24/2022  What is the name of the medication or equipment?  methylphenidate (RITALIN) 10 MG tablet *[facility] also has 20 mg in stock if Dr. Laury Axon wants to send that medication   Have you contacted your pharmacy to request a refill? Yes   Which pharmacy would you like this sent to?  [id number] - [place], [place] - [address] [place] [address] Phone: [phone] Fax: [phone]    Patient notified that their request is being sent to the clinical staff for review and that they should receive a response within 2 business days.   Please advise at Mobile [phone] (mobile)

## 2022-11-12 NOTE — Addendum Note (Signed)
Addended by: Eldred Manges on: 11/12/2022 03:33 PM   Modules accepted: Orders

## 2022-12-22 ENCOUNTER — Telehealth: Payer: Self-pay | Admitting: Family Medicine

## 2022-12-22 NOTE — Telephone Encounter (Signed)
Looks like we have patient taking Ritalin 10mg , 2 PO BID. Pt wanting 40mg  dose. Please advise

## 2022-12-22 NOTE — Telephone Encounter (Signed)
Medication: pt's pharmacy has the 10 mg and 20mg . She states it just needs to be 40mg  daily.   methylphenidate (RITALIN) 10 MG tablet  methylphenidate (RITALIN) 20 MG tablet  Has the patient contacted their pharmacy? Yes.     Preferred Pharmacy:    [facility] [address] [place], [place] - [address], [place] [address] Phone: [phone]  Fax: [phone]

## 2022-12-23 ENCOUNTER — Other Ambulatory Visit: Payer: Self-pay | Admitting: Family Medicine

## 2022-12-23 DIAGNOSIS — F988 Other specified behavioral and emotional disorders with onset usually occurring in childhood and adolescence: Secondary | ICD-10-CM

## 2022-12-23 MED ORDER — METHYLPHENIDATE HCL ER (LA) 40 MG PO CP24
40.0000 mg | ORAL_CAPSULE | ORAL | 0 refills | Status: DC
Start: 2022-12-23 — End: 2022-12-24

## 2022-12-23 NOTE — Telephone Encounter (Signed)
Noted  

## 2022-12-24 ENCOUNTER — Other Ambulatory Visit: Payer: Self-pay | Admitting: Family Medicine

## 2022-12-24 DIAGNOSIS — F988 Other specified behavioral and emotional disorders with onset usually occurring in childhood and adolescence: Secondary | ICD-10-CM

## 2022-12-24 MED ORDER — METHYLPHENIDATE HCL 10 MG PO TABS: ORAL_TABLET | ORAL | 0 refills | Status: DC

## 2022-12-24 NOTE — Telephone Encounter (Signed)
Patient said that 40 mg was called in to the pharmacy but that is not what she requested. Pt said that the extended release also costs more for her. Pt is requesting that we send in the methylphenidate (RITALIN LA)   prescription she has been getting which is 10 mg to be taken twice in the morning and twice in the afternoon. Pt would like this to be sent in as quick as possible so she can make one trip to pharmacy. RX should be sent to [facility] on [address]

## 2022-12-30 ENCOUNTER — Other Ambulatory Visit: Payer: Self-pay | Admitting: Family Medicine

## 2022-12-31 NOTE — Telephone Encounter (Signed)
Patient called asking about these refills.  She said that she generally only comes in when she needs a cortisone injection. But at this point she doesn't feel like she needs an injection but the medications are working well.  Please advise.  (She also said that last time she was here to see Dr Katrinka Blazing, he said something about a doctor that was opening a small surgical clinic that was opening in 2024?)

## 2023-01-04 NOTE — Telephone Encounter (Signed)
Patient called back following up. ?

## 2023-01-10 NOTE — Telephone Encounter (Signed)
Patient seen over 1 year ago. Sent her MyChart message that we will need to schedule a visit before he can do any refills.

## 2023-01-12 ENCOUNTER — Telehealth: Payer: Self-pay | Admitting: Family Medicine

## 2023-01-12 NOTE — Telephone Encounter (Signed)
Pt said that she has been seeing Dr. Katrinka Blazing for musculoskeletal issues. He prescribed gabapentin, meloxicam and duloxetine for her but is requiring her to come back to the office if she wants refills but the patient is not planning on getting anymore cortisone shots as she is considering surgery. Dr. Michaelle Copas office suggested that she follow up with her PCP to see if she feels comfortable taking over the refills of these medications. Please call her to advise

## 2023-01-13 ENCOUNTER — Other Ambulatory Visit: Payer: Self-pay | Admitting: Family Medicine

## 2023-01-13 MED ORDER — MELOXICAM 15 MG PO TABS: 15.0000 mg | ORAL_TABLET | Freq: Every day | ORAL | 0 refills | Status: DC

## 2023-01-13 MED ORDER — GABAPENTIN 400 MG PO CAPS: 400.0000 mg | ORAL_CAPSULE | Freq: Every day | ORAL | 1 refills | Status: DC

## 2023-01-13 MED ORDER — DULOXETINE HCL 20 MG PO CPEP: 20.0000 mg | ORAL_CAPSULE | Freq: Every day | ORAL | 0 refills | Status: DC

## 2023-01-13 NOTE — Telephone Encounter (Signed)
Pt made aware

## 2023-02-08 ENCOUNTER — Other Ambulatory Visit: Payer: Self-pay | Admitting: Family Medicine

## 2023-02-08 DIAGNOSIS — F988 Other specified behavioral and emotional disorders with onset usually occurring in childhood and adolescence: Secondary | ICD-10-CM

## 2023-02-08 NOTE — Telephone Encounter (Signed)
Requesting:Ritalin Contract: 08/24/22 UDS: 08/24/22 Last OV: 08/24/22 Next OV: 02/24/23 Last Refill: 12/24/22, #120--0 RF  Database:   Please advise

## 2023-02-08 NOTE — Telephone Encounter (Signed)
Medication: methylphenidate (RITALIN) 10 MG tablet  Has the patient contacted their pharmacy? No.   Preferred Pharmacy:   [facility] [address] [place] - [address] [place] [address] Phone: [phone]  Fax: [phone]

## 2023-02-09 MED ORDER — METHYLPHENIDATE HCL 10 MG PO TABS
ORAL_TABLET | ORAL | 0 refills | Status: DC
Start: 2023-02-09 — End: 2023-02-24

## 2023-02-24 ENCOUNTER — Ambulatory Visit: Payer: Self-pay | Admitting: Family Medicine

## 2023-02-24 ENCOUNTER — Ambulatory Visit (INDEPENDENT_AMBULATORY_CARE_PROVIDER_SITE_OTHER): Payer: Self-pay | Admitting: Family Medicine

## 2023-02-24 ENCOUNTER — Encounter: Payer: Self-pay | Admitting: Family Medicine

## 2023-02-24 VITALS — BP 120/88 | HR 82 | Temp 98.2°F | Resp 18 | Ht 63.0 in | Wt 189.0 lb

## 2023-02-24 DIAGNOSIS — F988 Other specified behavioral and emotional disorders with onset usually occurring in childhood and adolescence: Secondary | ICD-10-CM

## 2023-02-24 DIAGNOSIS — Z780 Asymptomatic menopausal state: Secondary | ICD-10-CM

## 2023-02-24 MED ORDER — ESTROGENS CONJUGATED 0.3 MG PO TABS
0.3000 mg | ORAL_TABLET | Freq: Every day | ORAL | Status: DC
Start: 2023-02-24 — End: 2023-02-24

## 2023-02-24 MED ORDER — METHYLPHENIDATE HCL 10 MG PO TABS: ORAL_TABLET | ORAL | 0 refills | Status: DC

## 2023-02-24 MED ORDER — ESTRADIOL 0.5 MG PO TABS
0.5000 mg | ORAL_TABLET | Freq: Every day | ORAL | Status: DC
Start: 2023-02-24 — End: 2023-02-24

## 2023-02-24 MED ORDER — PROGESTERONE 200 MG PO CAPS
ORAL_CAPSULE | ORAL | 3 refills | Status: DC
Start: 2023-02-24 — End: 2023-02-24

## 2023-02-24 MED ORDER — PROGESTERONE 200 MG PO CAPS
ORAL_CAPSULE | ORAL | 3 refills | Status: DC
Start: 2023-02-24 — End: 2023-04-15

## 2023-02-24 MED ORDER — ESTRADIOL 0.5 MG PO TABS
0.5000 mg | ORAL_TABLET | Freq: Every day | ORAL | Status: DC
Start: 2023-02-24 — End: 2023-04-15

## 2023-02-24 MED ORDER — METHYLPHENIDATE HCL 10 MG PO TABS
ORAL_TABLET | ORAL | 0 refills | Status: DC
Start: 2023-02-24 — End: 2023-03-31

## 2023-02-24 NOTE — Patient Instructions (Signed)
Menopause Menopause is the normal time of a woman's life when menstrual periods stop completely. It marks the natural end to a woman's ability to become pregnant. It can be defined as the absence of a menstrual period for 12 months without another medical cause. The transition to menopause (perimenopause) most often happens between the ages of 45 and 55, and can last for many years. During perimenopause, hormone levels change in your body, which can cause symptoms and affect your health. Menopause may increase your risk for: Weakened bones (osteoporosis), which causes fractures. Depression. Hardening and narrowing of the arteries (atherosclerosis), which can cause heart attacks and strokes. What are the causes? This condition is usually caused by a natural change in hormone levels that happens as you get older. The condition may also be caused by changes that are not natural, including: Surgery to remove both ovaries (surgical menopause). Side effects from some medicines, such as chemotherapy used to treat cancer (chemical menopause). What increases the risk? This condition is more likely to start at an earlier age if you have certain medical conditions or have undergone treatments, including: A tumor of the pituitary gland in the brain. A disease that affects the ovaries and hormones. Certain cancer treatments, such as chemotherapy or hormone therapy, or radiation therapy on the pelvis. Heavy smoking and excessive alcohol use. Family history of early menopause. This condition is also more likely to develop earlier in women who are very thin. What are the signs or symptoms? Symptoms of this condition include: Hot flashes. Irregular menstrual periods. Night sweats. Changes in feelings about sex. This could be a decrease in sex drive or an increased discomfort around your sexuality. Vaginal dryness and thinning of the vaginal walls. This may cause painful sex. Dryness of the skin and  development of wrinkles. Headaches. Problems sleeping (insomnia). Mood swings or irritability. Memory problems. Weight gain. Hair growth on the face and chest. Bladder infections or problems with urinating. How is this diagnosed? This condition is diagnosed based on your medical history, a physical exam, your age, your menstrual history, and your symptoms. Hormone tests may also be done. How is this treated? In some cases, no treatment is needed. You and your health care provider should make a decision together about whether treatment is necessary. Treatment will be based on your individual condition and preferences. Treatment for this condition focuses on managing symptoms. Treatment may include: Menopausal hormone therapy (MHT). Medicines to treat specific symptoms or complications. Acupuncture. Vitamin or herbal supplements. Before starting treatment, make sure to let your health care provider know if you have a personal or family history of these conditions: Heart disease. Breast cancer. Blood clots. Diabetes. Osteoporosis. Follow these instructions at home: Lifestyle Do not use any products that contain nicotine or tobacco, such as cigarettes, e-cigarettes, and chewing tobacco. If you need help quitting, ask your health care provider. Get at least 30 minutes of physical activity on 5 or more days each week. Avoid alcoholic and caffeinated beverages, as well as spicy foods. This may help prevent hot flashes. Get 7-8 hours of sleep each night. If you have hot flashes, try: Dressing in layers. Avoiding things that may trigger hot flashes, such as spicy food, warm places, or stress. Taking slow, deep breaths when a hot flash starts. Keeping a fan in your home and office. Find ways to manage stress, such as deep breathing, meditation, or journaling. Consider going to group therapy with other women who are having menopause symptoms. Ask your health care   provider about recommended  group therapy meetings. Eating and drinking  Eat a healthy, balanced diet that contains whole grains, lean protein, low-fat dairy, and plenty of fruits and vegetables. Your health care provider may recommend adding more soy to your diet. Foods that contain soy include tofu, tempeh, and soy milk. Eat plenty of foods that contain calcium and vitamin D for bone health. Items that are rich in calcium include low-fat milk, yogurt, beans, almonds, sardines, broccoli, and kale. Medicines Take over-the-counter and prescription medicines only as told by your health care provider. Talk with your health care provider before starting any herbal supplements. If prescribed, take vitamins and supplements as told by your health care provider. General instructions  Keep track of your menstrual periods, including: When they occur. How heavy they are and how long they last. How much time passes between periods. Keep track of your symptoms, noting when they start, how often you have them, and how long they last. Use vaginal lubricants or moisturizers to help with vaginal dryness and improve comfort during sex. Keep all follow-up visits. This is important. This includes any group therapy or counseling. Contact a health care provider if: You are still having menstrual periods after age 65. You have pain during sex. You have not had a period for 12 months and you develop vaginal bleeding. Get help right away if you have: Severe depression. Excessive vaginal bleeding. Pain when you urinate. A fast or irregular heartbeat (palpitations). Severe headaches. Abdominal pain or severe indigestion. Summary Menopause is a normal time of life when menstrual periods stop completely. It is usually defined as the absence of a menstrual period for 12 months without another medical cause. The transition to menopause (perimenopause) most often happens between the ages of 84 and 3 and can last for several years. Symptoms  can be managed through medicines, lifestyle changes, and complementary therapies such as acupuncture. Eat a balanced diet that is rich in nutrients to promote bone health and heart health and to manage symptoms during menopause. This information is not intended to replace advice given to you by your health care provider. Make sure you discuss any questions you have with your health care provider. Document Revised: 02/29/2020 Document Reviewed: 11/15/2019 Elsevier Patient Education  2024 [employer].

## 2023-02-24 NOTE — Progress Notes (Signed)
Established Patient Office Visit  Subjective   Patient ID: Cindy Stephens, female    DOB: 1965/09/08  Age: 57 y.o. MRN: [id number]  Chief Complaint  Patient presents with   ADD   Follow-up    HPI Discussed the use of AI scribe software for clinical note transcription with the patient, who gave verbal consent to proceed.  History of Present Illness   The patient, with a history of ADD, presents for a follow-up appointment. She reports that her current medication, Ritalin, is managing her symptoms well. She takes two doses in the morning and two after lunch. The patient expresses frustration with the process of obtaining her medication due to it being a controlled substance. She has abandoned the 20mg  dosage due to consistent unavailability at her pharmacy.  In addition to her ADD management, the patient is experiencing symptoms of menopause, including hot flashes, joint pain, and a complete loss of libido. She has been researching hormone replacement therapy and expresses interest in starting treatment. She has been reading a book by Dr. Robinette Haines and is considering separate estrogen and progesterone treatments, as well as potentially testosterone. She expresses frustration with the lack of options for women's sexual health compared to men's.      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   Preventative health care 08/24/2022   Polydipsia 08/24/2022   History of attention deficit disorder 08/24/2022   Urinary incontinence 08/24/2022   High risk medication use 08/24/2022   Polyarthralgia 12/10/2021   Left carpal tunnel syndrome 11/27/2019   Acute non-recurrent pansinusitis 07/14/2018   Hot flashes due to menopause 05/09/2017   Patellofemoral arthritis of left knee 02/28/2017   Acute lateral meniscus tear of left knee 02/01/2017   Attention deficit disorder 06/15/2016   Moderate single current episode of  major depressive disorder (HCC) 06/15/2016   Oral pain 06/15/2016   Asthma due to environmental allergies 06/15/2016   Allergy history unknown 06/15/2016   Insomnia 12/30/2014   Popliteal bursitis of right knee 12/17/2014   Depression 08/19/2014   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   Adhesive capsulitis of left shoulder 05/02/2014   Snoring 03/07/2014   Peroneal tendinitis of left lower extremity 02/12/2014   Bursitis of shoulder, left 11/14/2013   Left upper extremity numbness 11/14/2013   Obesity (BMI 30-39.9) 11/12/2013   Past Medical History:  Diagnosis Date   Allergic rhinitis    Depression    GERD (gastroesophageal reflux disease)    Shoulder dislocation 2007   Left Shoulder   Urinary incontinence    Past Surgical History:  Procedure Laterality Date   APPENDECTOMY     ELBOW SURGERY Left 2007   elbow was shattered   Social History   Tobacco Use   Smoking status: Never   Smokeless tobacco: Never  Substance Use Topics   Alcohol use: Yes    Alcohol/week: 0.0 standard drinks of alcohol    Comment: Occ   Drug use: No   Social History   Socioeconomic History   Marital status: Married    Spouse name: Not on file   Number of children: Not on file   Years of education: Not on file   Highest education level: Not on file  Occupational History   Not on file  Tobacco Use   Smoking status: Never   Smokeless tobacco: Never  Substance and Sexual Activity   Alcohol  use: Yes    Alcohol/week: 0.0 standard drinks of alcohol    Comment: Occ   Drug use: No   Sexual activity: Yes    Partners: Male  Other Topics Concern   Not on file  Social History Narrative   Lives with husband and children in a 2 story home.  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  Education: BS   Social Determinants of Health   Financial Resource Strain: Not on file  Food Insecurity: No Food Insecurity (08/24/2022)   Hunger Vital Sign    Worried About Running Out of  Food in the Last Year: Never true    Ran Out of Food in the Last Year: Never true  Transportation Needs: Not on file  Physical Activity: Inactive (08/24/2022)   Exercise Vital Sign    Days of Exercise per Week: 0 days    Minutes of Exercise per Session: 0 min  Stress: Not on file  Social Connections: Not on file  Intimate Partner Violence: Not on file   Family Status  Relation Name Status   Mother  Alive   Father  Deceased   Sister  Alive   Sister  (Not Specified)   Brother  Audiological scientist  (Not Specified)   PGM  (Not Specified)   Other  (Not Specified)   Other  (Not Specified)   Other  (Not Specified)  No partnership data on file   Family History  Problem Relation Age of Onset   Heart disease Mother    Thyroid disease Mother    Arthritis Mother    Arthritis Father    Hyperlipidemia Father    Alzheimer's disease Father    Hypertension Father        Paternal Family   Arthritis Sister    Osteoarthritis Brother    Diabetes Paternal Grandmother        PGGM   Arthritis Other    Heart disease Other        Pateternal Family   Stroke Other        Paternal Family   Allergies  Allergen Reactions   Sulfa Antibiotics Hives and Nausea Only      Review of Systems  Constitutional:  Negative for fever and malaise/fatigue.  HENT:  Negative for congestion.   Eyes:  Negative for blurred vision.  Respiratory:  Negative for shortness of breath.   Cardiovascular:  Negative for chest pain, palpitations and leg swelling.  Gastrointestinal:  Negative for abdominal pain, blood in stool and nausea.  Genitourinary:  Negative for dysuria and frequency.  Musculoskeletal:  Negative for falls.  Skin:  Negative for rash.  Neurological:  Negative for dizziness, loss of consciousness and headaches.  Endo/Heme/Allergies:  Negative for environmental allergies.  Psychiatric/Behavioral:  Negative for depression. The patient is not nervous/anxious.       Objective:     BP 120/88 (BP  Location: Left Arm, Patient Position: Sitting, Cuff Size: Large)   Pulse 82   Temp 98.2 F (36.8 C) (Oral)   Resp 18   Ht 5\' 3"  (1.6 m)   Wt 189 lb (85.7 kg)   LMP 06/13/2016   SpO2 96%   BMI 33.48 kg/m  BP Readings from Last 3 Encounters:  02/24/23 120/88  08/24/22 120/80  12/10/21 138/88   Wt Readings from Last 3 Encounters:  02/24/23 189 lb (85.7 kg)  08/24/22 191 lb (86.6 kg)  12/10/21 188 lb (85.3 kg)   SpO2 Readings from Last 3 Encounters:  02/24/23  96%  08/24/22 96%  12/10/21 97%      Physical Exam Vitals and nursing note reviewed.  Constitutional:      General: She is not in acute distress.    Appearance: Normal appearance. She is well-developed.  HENT:     Head: Normocephalic and atraumatic.  Eyes:     General: No scleral icterus.       Right eye: No discharge.        Left eye: No discharge.  Cardiovascular:     Rate and Rhythm: Normal rate and regular rhythm.     Heart sounds: No murmur heard. Pulmonary:     Effort: Pulmonary effort is normal. No respiratory distress.     Breath sounds: Normal breath sounds.  Musculoskeletal:        General: Normal range of motion.     Cervical back: Normal range of motion and neck supple.     Right lower leg: No edema.     Left lower leg: No edema.  Skin:    General: Skin is warm and dry.  Neurological:     Mental Status: She is alert and oriented to person, place, and time.  Psychiatric:        Mood and Affect: Mood normal.        Behavior: Behavior normal.        Thought Content: Thought content normal.        Judgment: Judgment normal.      No results found for any visits on 02/24/23.  Last CBC Lab Results  Component Value Date   WBC 4.5 08/24/2022   HGB 14.4 08/24/2022   HCT 42.5 08/24/2022   MCV 88.6 08/24/2022   MCH 28.9 12/03/2016   RDW 13.5 08/24/2022   PLT 231.0 08/24/2022   Last metabolic panel Lab Results  Component Value Date   GLUCOSE 85 08/24/2022   NA 140 08/24/2022   K 4.7  08/24/2022   CL 101 08/24/2022   CO2 32 08/24/2022   BUN 22 08/24/2022   CREATININE 0.70 08/24/2022   GFR 95.84 08/24/2022   CALCIUM 10.1 08/24/2022   PROT 7.2 08/24/2022   ALBUMIN 4.4 08/24/2022   BILITOT 0.4 08/24/2022   ALKPHOS 87 08/24/2022   AST 22 08/24/2022   ALT 31 08/24/2022   Last lipids Lab Results  Component Value Date   CHOL 236 (H) 08/24/2022   HDL 63.80 08/24/2022   LDLCALC 71 12/03/2016   LDLDIRECT 132.0 08/24/2022   TRIG 232.0 (H) 08/24/2022   CHOLHDL 4 08/24/2022   Last hemoglobin A1c Lab Results  Component Value Date   HGBA1C 5.5 08/24/2022   Last thyroid functions Lab Results  Component Value Date   TSH 2.61 08/24/2022   Last vitamin D No results found for: "25OHVITD2", "25OHVITD3", "VD25OH" Last vitamin B12 and Folate Lab Results  Component Value Date   VITAMINB12 580 11/12/2013      The 10-year ASCVD risk score (Arnett DK, et al., 2019) is: 2.4%    Assessment & Plan:   Problem List Items Addressed This Visit       Unprioritized   Attention deficit disorder - Primary   Relevant Medications   methylphenidate (RITALIN) 10 MG tablet   methylphenidate (RITALIN) 10 MG tablet (Start on 03/26/2023)   methylphenidate (RITALIN) 10 MG tablet (Start on 04/26/2023)   Other Visit Diagnoses     Menopause       Relevant Medications   estradiol (ESTRACE) 0.5 MG tablet   progesterone (PROMETRIUM) 200  MG capsule     Assessment and Plan    Attention Deficit Disorder Stable on Ritalin 100mg , taking two in the morning and two after lunch. No issues reported. -Continue current regimen. -Send three post-dated prescriptions to the pharmacy of patient's choice upon request.  Menopausal Symptoms Reports hot flashes, joint pain, and decreased libido. Previous prescription for combined estrogen/progesterone was not filled due to cost. Patient has done research and is interested in separate estrogen and progesterone prescriptions. -Prescribe  estradiol 0.3mg  and progesterone 200mg . -Advise patient to call in with preferred pharmacy for sending prescriptions. -Plan to titrate or adjust as needed based on patient's response to therapy.  General Health Maintenance / Followup Plans -Follow up as needed for medication refills and to assess response to hormone replacement therapy.        Return in about 6 months (around 08/24/2023), or if symptoms worsen or fail to improve.    Donato Schultz, DO

## 2023-03-26 ENCOUNTER — Other Ambulatory Visit: Payer: Self-pay | Admitting: Family Medicine

## 2023-03-26 DIAGNOSIS — F321 Major depressive disorder, single episode, moderate: Secondary | ICD-10-CM

## 2023-03-26 DIAGNOSIS — Z789 Other specified health status: Secondary | ICD-10-CM

## 2023-03-30 ENCOUNTER — Telehealth: Payer: Self-pay | Admitting: Family Medicine

## 2023-03-30 DIAGNOSIS — F988 Other specified behavioral and emotional disorders with onset usually occurring in childhood and adolescence: Secondary | ICD-10-CM

## 2023-03-30 DIAGNOSIS — Z780 Asymptomatic menopausal state: Secondary | ICD-10-CM

## 2023-03-30 DIAGNOSIS — F909 Attention-deficit hyperactivity disorder, unspecified type: Secondary | ICD-10-CM

## 2023-03-30 NOTE — Telephone Encounter (Signed)
Pt would like estradiol (ESTRACE) 0.5 MG tablet and progesterone (PROMETRIUM) 200 MG capsule sent to [facility] as a 90 day supply. Also would like methylphenidate (RITALIN) 10 MG tablet changed to 120 tablets for October and November.   [id number] - [place], [place] - [address] [place] [address] Phone: [phone]  Fax: [phone]

## 2023-03-31 NOTE — Telephone Encounter (Signed)
Requesting: Ritalin Contract: 03/24 UDS: 03/24 Last OV: 02/24/23 Next OV: n/a Last Refill: 02/24/2023, #120--0 RF Database:   Please advise

## 2023-04-01 MED ORDER — METHYLPHENIDATE HCL 10 MG PO TABS
ORAL_TABLET | ORAL | 0 refills | Status: DC
Start: 2023-04-01 — End: 2023-08-08

## 2023-04-04 NOTE — Telephone Encounter (Signed)
Pt called back stating that her estradiol and progesterone had not been sent in to her pharmacy. Pt also stated that for some reason the scripts that were sent in for 60 tablets instead of 120. Pt would like to have this corrected to reflect 120 tablets going forward in order to limit pharmacy trips and overall cost. Routed HP due to internal error.

## 2023-04-15 ENCOUNTER — Other Ambulatory Visit: Payer: Self-pay | Admitting: Family Medicine

## 2023-04-15 DIAGNOSIS — Z780 Asymptomatic menopausal state: Secondary | ICD-10-CM

## 2023-04-15 DIAGNOSIS — F988 Other specified behavioral and emotional disorders with onset usually occurring in childhood and adolescence: Secondary | ICD-10-CM

## 2023-04-15 MED ORDER — PROGESTERONE 200 MG PO CAPS
ORAL_CAPSULE | ORAL | 3 refills | Status: DC
Start: 2023-04-15 — End: 2023-09-22

## 2023-04-15 MED ORDER — ESTRADIOL 0.5 MG PO TABS: 0.5000 mg | ORAL_TABLET | Freq: Every day | ORAL | 1 refills | Status: DC

## 2023-04-15 MED ORDER — ESTRADIOL 0.5 MG PO TABS
0.5000 mg | ORAL_TABLET | Freq: Every day | ORAL | 1 refills | Status: DC
Start: 2023-04-15 — End: 2023-09-22

## 2023-04-15 MED ORDER — METHYLPHENIDATE HCL 10 MG PO TABS
ORAL_TABLET | ORAL | 0 refills | Status: DC
Start: 2023-04-15 — End: 2023-07-04

## 2023-04-15 MED ORDER — PROGESTERONE 200 MG PO CAPS: ORAL_CAPSULE | ORAL | 3 refills | Status: DC

## 2023-04-15 NOTE — Telephone Encounter (Signed)
Pt still has not received her estradiol 0.5 MG (ESTRACE)  and progesterone (PROMETRIUM) 200 MG. Also states she is supposed to receive 120 tablets of ritalin for every refill and that has not been corrected. Please mychart or call the pt.   [id number] - [place], [place] - [address] [place] [address] Phone: [phone]  Fax: [phone]

## 2023-04-15 NOTE — Addendum Note (Signed)
Addended by: Roxanne Gates on: 04/15/2023 04:40 PM   Modules accepted: Orders

## 2023-04-15 NOTE — Telephone Encounter (Signed)
Please advise regarding the Ritalin. See below

## 2023-04-17 ENCOUNTER — Other Ambulatory Visit: Payer: Self-pay | Admitting: Family Medicine

## 2023-04-18 ENCOUNTER — Other Ambulatory Visit: Payer: Self-pay | Admitting: Family Medicine

## 2023-04-18 MED ORDER — DULOXETINE HCL 20 MG PO CPEP: 20.0000 mg | ORAL_CAPSULE | Freq: Every day | ORAL | 1 refills | Status: DC

## 2023-07-04 ENCOUNTER — Other Ambulatory Visit: Payer: Self-pay | Admitting: Family Medicine

## 2023-07-04 DIAGNOSIS — F988 Other specified behavioral and emotional disorders with onset usually occurring in childhood and adolescence: Secondary | ICD-10-CM

## 2023-07-04 MED ORDER — GABAPENTIN 400 MG PO CAPS: 400.0000 mg | ORAL_CAPSULE | Freq: Every day | ORAL | 0 refills | Status: DC

## 2023-07-04 NOTE — Telephone Encounter (Unsigned)
Copied from CRM [id number]. Topic: Clinical - Medication Refill >> Jul 04, 2023 11:29 AM Fuller Mandril wrote: Most Recent Primary Care Visit:  Provider: Zola Button, Myrene Buddy R  Department: [facility]  Visit Type: OFFICE VISIT  Date: 02/24/2023  Medication:  methylphenidate (RITALIN) 10 MG tablet meloxicam (MOBIC) 15 MG tablet  gabapentin (NEURONTIN) 400 MG capsule    Has the patient contacted their pharmacy? Yes (Agent: If no, request that the patient contact the pharmacy for the refill. If patient does not wish to contact the pharmacy document the reason why and proceed with request.) (Agent: If yes, when and what did the pharmacy advise?)  Is this the correct pharmacy for this prescription? Yes If no, delete pharmacy and type the correct one.  This is the patient's preferred pharmacy:  [facility] [address], [place] - [address] [place] [address] Phone: [phone] Fax: [phone]   Has the prescription been filled recently? No  Is the patient out of the medication? No  Has the patient been seen for an appointment in the last year OR does the patient have an upcoming appointment? Yes  Can we respond through MyChart? Yes  Agent: Please be advised that Rx refills may take up to 3 business days. We ask that you follow-up with your pharmacy.

## 2023-07-04 NOTE — Telephone Encounter (Signed)
Requesting:Ritalin  Contract: 08/24/22 UDS: 08/24/22 Last Visit: 08/24/22 Next Visit: None Last Refill:  04/15/23 #120 and 0RF   Please Advise

## 2023-07-05 ENCOUNTER — Other Ambulatory Visit: Payer: Self-pay | Admitting: Family Medicine

## 2023-07-05 MED ORDER — MELOXICAM 15 MG PO TABS: 15.0000 mg | ORAL_TABLET | Freq: Every day | ORAL | 0 refills | Status: DC

## 2023-07-05 MED ORDER — METHYLPHENIDATE HCL 10 MG PO TABS: ORAL_TABLET | ORAL | 0 refills | Status: DC

## 2023-07-06 ENCOUNTER — Telehealth: Payer: Self-pay | Admitting: Family Medicine

## 2023-07-06 MED ORDER — GABAPENTIN 400 MG PO CAPS: 400.0000 mg | ORAL_CAPSULE | Freq: Every day | ORAL | 0 refills | Status: DC

## 2023-07-06 NOTE — Telephone Encounter (Signed)
Copied from CRM [id number]. Topic: Clinical - Prescription Issue >> Jul 06, 2023 12:06 PM Florestine Avers wrote: Reason for CRM:  Patient called in stating that her physician recently called in multiple prescriptions in for her. The patient said she gave her 90 days of all medications except for the gabapentin (NEURONTIN) 400 MG capsule. Patient is requesting that the provider change the prescription to 90 days instead of a 30 day supply.

## 2023-07-06 NOTE — Telephone Encounter (Signed)
Rx sent 

## 2023-07-06 NOTE — Addendum Note (Signed)
Addended by: Roxanne Gates on: 07/06/2023 02:31 PM   Modules accepted: Orders

## 2023-08-08 ENCOUNTER — Other Ambulatory Visit: Payer: Self-pay | Admitting: Family Medicine

## 2023-08-08 DIAGNOSIS — F909 Attention-deficit hyperactivity disorder, unspecified type: Secondary | ICD-10-CM

## 2023-08-08 DIAGNOSIS — F988 Other specified behavioral and emotional disorders with onset usually occurring in childhood and adolescence: Secondary | ICD-10-CM

## 2023-08-08 NOTE — Telephone Encounter (Signed)
 Last Fill: 07/05/23 120 tabs/0 refills  Last OV: 02/24/23 Next OV: None Scheduled  Routing to provider for review/authorization.

## 2023-08-08 NOTE — Telephone Encounter (Unsigned)
 Copied from CRM [id number]. Topic: Clinical - Medication Refill >> Aug 08, 2023  2:21 PM Florestine Avers wrote: Most Recent Primary Care Visit:  Provider: Seabron Spates R  Department: [facility]  Visit Type: OFFICE VISIT  Date: 02/24/2023  Medication: methylphenidate (RITALIN) 10 MG tablet  Has the patient contacted their pharmacy? Yes (Agent: If no, request that the patient contact the pharmacy for the refill. If patient does not wish to contact the pharmacy document the reason why and proceed with request.) (Agent: If yes, when and what did the pharmacy advise?)  Is this the correct pharmacy for this prescription? Yes If no, delete pharmacy and type the correct one.  This is the patient's preferred pharmacy:  [facility] [address] [place] - [address] [place] [address] Phone: [phone] Fax: [phone]   Has the prescription been filled recently? No  Is the patient out of the medication? Yes  Has the patient been seen for an appointment in the last year OR does the patient have an upcoming appointment? Yes  Can we respond through MyChart? Yes  Agent: Please be advised that Rx refills may take up to 3 business days. We ask that you follow-up with your pharmacy.

## 2023-08-08 NOTE — Telephone Encounter (Signed)
 Requesting: methyphenidate 10mg  Contract: Yes UDS: 08/24/22 Last Visit: 02/24/2023 Next Visit: Visit date not found Last Refill: 07/05/23  Please Advise

## 2023-08-10 MED ORDER — METHYLPHENIDATE HCL 10 MG PO TABS: ORAL_TABLET | ORAL | 0 refills | Status: DC

## 2023-08-19 ENCOUNTER — Telehealth: Payer: Self-pay

## 2023-08-19 NOTE — Telephone Encounter (Signed)
 Copied from CRM [id number]. Topic: Clinical - Medical Advice >> Aug 19, 2023  4:06 PM Melissa C wrote: Reason for CRM: patient checked with [facility] about getting a Measles booster and was told she needs an order from her doctor to get one. She wants one due to the type of measles shot she probably had when she was a baby and she has to go to [place] to visit family where the measles outbreak is. She asked that you let her know one way or the other if it can be done and also if you have any questions please contact her. Thank you.

## 2023-08-22 ENCOUNTER — Encounter: Payer: Self-pay | Admitting: Family Medicine

## 2023-08-23 NOTE — Telephone Encounter (Signed)
 Sent pt a mychart message yesterday

## 2023-08-26 ENCOUNTER — Telehealth: Payer: Self-pay

## 2023-08-26 NOTE — Telephone Encounter (Signed)
 Copied from CRM [id number]. Topic: General - Other >> Aug 26, 2023  2:50 PM Melissa C wrote: Reason for CRM: patient was confused as she had conversed through [employer] regarding setting up an appointment and they stated to schedule a follow up, however she thinks she is due for an annual. If an annual is needed she would like to schedule labs through preferred outside lab as she is uninsured. She doesn't want to schedule a follow up to have to then have to schedule an annual as well. She also had wanted to schedule a measles booster. Please call patient regarding this. Thank you

## 2023-08-29 NOTE — Telephone Encounter (Signed)
 Pts last CPE was 08/2022. She can have a CPE and we can get a measles titer to determine if she needs a booster

## 2023-09-01 MED ORDER — MEASLES, MUMPS & RUBELLA VAC ~~LOC~~ SUSR: 0.5000 mL | Freq: Once | SUBCUTANEOUS | 0 refills | Status: AC

## 2023-09-01 NOTE — Addendum Note (Signed)
 Addended by: Thelma Barge D on: 09/01/2023 09:26 AM   Modules accepted: Orders

## 2023-09-16 ENCOUNTER — Encounter: Payer: Self-pay | Admitting: Family Medicine

## 2023-09-22 ENCOUNTER — Ambulatory Visit (INDEPENDENT_AMBULATORY_CARE_PROVIDER_SITE_OTHER): Payer: Self-pay | Admitting: Family Medicine

## 2023-09-22 ENCOUNTER — Encounter: Payer: Self-pay | Admitting: Family Medicine

## 2023-09-22 VITALS — BP 123/81 | HR 105 | Ht 63.0 in | Wt 195.8 lb

## 2023-09-22 DIAGNOSIS — F909 Attention-deficit hyperactivity disorder, unspecified type: Secondary | ICD-10-CM

## 2023-09-22 DIAGNOSIS — J45909 Unspecified asthma, uncomplicated: Secondary | ICD-10-CM

## 2023-09-22 DIAGNOSIS — F32A Depression, unspecified: Secondary | ICD-10-CM

## 2023-09-22 DIAGNOSIS — L700 Acne vulgaris: Secondary | ICD-10-CM

## 2023-09-22 DIAGNOSIS — Z79899 Other long term (current) drug therapy: Secondary | ICD-10-CM

## 2023-09-22 DIAGNOSIS — Z0000 Encounter for general adult medical examination without abnormal findings: Secondary | ICD-10-CM

## 2023-09-22 DIAGNOSIS — F321 Major depressive disorder, single episode, moderate: Secondary | ICD-10-CM

## 2023-09-22 DIAGNOSIS — F988 Other specified behavioral and emotional disorders with onset usually occurring in childhood and adolescence: Secondary | ICD-10-CM

## 2023-09-22 DIAGNOSIS — G4700 Insomnia, unspecified: Secondary | ICD-10-CM

## 2023-09-22 DIAGNOSIS — Z789 Other specified health status: Secondary | ICD-10-CM

## 2023-09-22 DIAGNOSIS — Z780 Asymptomatic menopausal state: Secondary | ICD-10-CM

## 2023-09-22 DIAGNOSIS — Z8619 Personal history of other infectious and parasitic diseases: Secondary | ICD-10-CM

## 2023-09-22 MED ORDER — DULOXETINE HCL 20 MG PO CPEP
20.0000 mg | ORAL_CAPSULE | Freq: Every day | ORAL | 1 refills | Status: DC
Start: 2023-09-22 — End: 2024-04-03

## 2023-09-22 MED ORDER — GABAPENTIN 400 MG PO CAPS: 400.0000 mg | ORAL_CAPSULE | Freq: Every day | ORAL | 0 refills | Status: DC

## 2023-09-22 MED ORDER — TAZAROTENE 0.05 % EX CREA: TOPICAL_CREAM | Freq: Every day | CUTANEOUS | 5 refills | Status: DC

## 2023-09-22 MED ORDER — METHYLPHENIDATE HCL 10 MG PO TABS: ORAL_TABLET | ORAL | 0 refills | Status: DC

## 2023-09-22 MED ORDER — MONTELUKAST SODIUM 10 MG PO TABS: 10.0000 mg | ORAL_TABLET | Freq: Every day | ORAL | 1 refills | Status: DC

## 2023-09-22 MED ORDER — ESTRADIOL 0.5 MG PO TABS: 0.5000 mg | ORAL_TABLET | Freq: Every day | ORAL | 1 refills | Status: DC

## 2023-09-22 MED ORDER — MELOXICAM 15 MG PO TABS: 15.0000 mg | ORAL_TABLET | Freq: Every day | ORAL | 0 refills | Status: DC

## 2023-09-22 MED ORDER — PROGESTERONE 200 MG PO CAPS: ORAL_CAPSULE | ORAL | 3 refills | Status: DC

## 2023-09-22 MED ORDER — BUPROPION HCL ER (XL) 300 MG PO TB24
300.0000 mg | ORAL_TABLET | Freq: Every day | ORAL | 1 refills | Status: DC
Start: 2023-09-22 — End: 2024-04-03

## 2023-09-22 MED ORDER — ALBUTEROL SULFATE HFA 108 (90 BASE) MCG/ACT IN AERS: 2.0000 | INHALATION_SPRAY | Freq: Four times a day (QID) | RESPIRATORY_TRACT | 0 refills | Status: AC | PRN

## 2023-09-22 MED ORDER — ZOLPIDEM TARTRATE 5 MG PO TABS: 5.0000 mg | ORAL_TABLET | Freq: Every evening | ORAL | 1 refills | Status: AC | PRN

## 2023-09-22 NOTE — Assessment & Plan Note (Signed)
 [facility] UTD CHECK LABS  SEE AVS  Health Maintenance  Topic Date Due   HIV Screening  Never done   Hepatitis C Screening  Never done   Pneumococcal Vaccine 57-59 Years old (1 of 2 - PCV) Never done   MAMMOGRAM  12/26/2014   Cervical Cancer Screening (HPV/Pap Cotest)  10/27/2023 (Originally 01/17/2022)   Colonoscopy  09/21/2024 (Originally 12/25/2009)   COVID-19 Vaccine (6 - Moderna risk 2024-25 season) 09/28/2023   INFLUENZA VACCINE  01/13/2024   DTaP/Tdap/Td (3 - Td or Tdap) 03/29/2033   Zoster Vaccines- Shingrix  Completed   HPV VACCINES  Aged Out   Meningococcal B Vaccine  Aged Out

## 2023-09-22 NOTE — Progress Notes (Signed)
 2  Established Patient Office Visit  Subjective   Patient ID: Cindy Stephens, female    DOB: Jan 31, 1966  Age: 58 y.o. MRN: [id number]  No chief complaint on file.   HPI Discussed the use of AI scribe software for clinical note transcription with the patient, who gave verbal consent to proceed.  History of Present Illness Cindy Stephens is a 58 year old female who presents for a physical exam and review of lab results.  Recent lab work revealed a slightly elevated LDL cholesterol level of 105 mg/dL, while HDL cholesterol was 66 mg/dL. Her A1c was 5.6%, indicating good glycemic control. She has a family history of cholesterol issues, with many relatives on cholesterol medications. She is taking over-the-counter fish oil regularly and believes weight loss might help improve her cholesterol levels.  She continues to experience knee problems, particularly with her left knee, which 'gets stuck and hurts.' She has a history of an 80% meniscus tear that healed, but suspects a new tear. Her kneecaps are described as floating and subluxating. She has not seen her orthopedic doctor recently due to financial constraints.  She reports stable carpal tunnel symptoms and new pain in her right thumb joint, which she suspects may be arthritis. Her father had severe thumb arthritis requiring surgery.  She is currently using several medications including Ritalin, montelukast, Cymbalta, Estrace, gabapentin, meloxicam, progesterone, Tazorac, and zolpidem. She also uses liquid minoxidil for hair loss and occasionally uses Ambien for sleep when traveling.  She mentions financial constraints affecting her ability to seek certain medical treatments and insurance coverage. No new surgeries or changes in family history.    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   Preventative health care 08/24/2022   Polydipsia 08/24/2022   History of attention  deficit disorder 08/24/2022   Urinary incontinence 08/24/2022   High risk medication use 08/24/2022   Polyarthralgia 12/10/2021   Left carpal tunnel syndrome 11/27/2019   Acute non-recurrent pansinusitis 07/14/2018   Hot flashes due to menopause 05/09/2017   Patellofemoral arthritis of left knee 02/28/2017   Acute lateral meniscus tear of left knee 02/01/2017   Attention deficit disorder 06/15/2016   Moderate single current episode of major depressive disorder (HCC) 06/15/2016   Oral pain 06/15/2016   Asthma due to environmental allergies 06/15/2016   Allergy history unknown 06/15/2016   Insomnia 12/30/2014   Popliteal bursitis of right knee 12/17/2014   Depression 08/19/2014   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   Adhesive capsulitis of left shoulder 05/02/2014   Snoring 03/07/2014   Peroneal tendinitis of left lower extremity 02/12/2014   Bursitis of shoulder, left 11/14/2013   Left upper extremity numbness 11/14/2013   Obesity (BMI 30-39.9) 11/12/2013   Past Medical History:  Diagnosis Date   Allergic rhinitis    Depression    GERD (gastroesophageal reflux disease)    Shoulder dislocation 2007   Left Shoulder   Urinary incontinence    Past Surgical History:  Procedure Laterality Date   APPENDECTOMY     ELBOW SURGERY Left 2007   elbow was shattered   Social History   Tobacco Use   Smoking status: Never   Smokeless tobacco: Never  Substance Use Topics   Alcohol use: Yes    Alcohol/week: 0.0 standard drinks of alcohol    Comment: Occ   Drug use: No   Social History   Socioeconomic History  Marital status: Married    Spouse name: Not on file   Number of children: Not on file   Years of education: Not on file   Highest education level: Not on file  Occupational History   Not on file  Tobacco Use   Smoking status: Never   Smokeless tobacco: Never  Substance and Sexual Activity   Alcohol use: Yes    Alcohol/week: 0.0  standard drinks of alcohol    Comment: Occ   Drug use: No   Sexual activity: Yes    Partners: Male  Other Topics Concern   Not on file  Social History Narrative   Lives with husband and children in a 2 story home.  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  Education: BS   Social Drivers of Corporate investment banker Strain: Not on file  Food Insecurity: No Food Insecurity (08/24/2022)   Hunger Vital Sign    Worried About Running Out of Food in the Last Year: Never true    Ran Out of Food in the Last Year: Never true  Transportation Needs: Not on file  Physical Activity: Inactive (08/24/2022)   Exercise Vital Sign    Days of Exercise per Week: 0 days    Minutes of Exercise per Session: 0 min  Stress: Not on file  Social Connections: Not on file  Intimate Partner Violence: Not on file   Family Status  Relation Name Status   Mother  Alive   Father  Deceased   Sister  Alive   Sister  (Not Specified)   Brother  Audiological scientist  (Not Specified)   PGM  (Not Specified)   Other  (Not Specified)   Other  (Not Specified)   Other  (Not Specified)  No partnership data on file   Family History  Problem Relation Age of Onset   Heart disease Mother    Thyroid disease Mother    Arthritis Mother    Arthritis Father    Hyperlipidemia Father    Alzheimer's disease Father    Hypertension Father        Paternal Family   Arthritis Sister    Osteoarthritis Brother    Diabetes Paternal Grandmother        PGGM   Arthritis Other    Heart disease Other        Pateternal Family   Stroke Other        Paternal Family   Allergies  Allergen Reactions   Sulfa Antibiotics Hives and Nausea Only      Review of Systems  Constitutional:  Negative for chills, fever and malaise/fatigue.  HENT:  Negative for congestion and hearing loss.   Eyes:  Negative for discharge.  Respiratory:  Negative for cough, sputum production and shortness of breath.   Cardiovascular:  Negative for chest pain,  palpitations and leg swelling.  Gastrointestinal:  Negative for abdominal pain, blood in stool, constipation, diarrhea, heartburn, nausea and vomiting.  Genitourinary:  Negative for dysuria, frequency, hematuria and urgency.  Musculoskeletal:  Positive for joint pain. Negative for back pain, falls and myalgias.  Skin:  Negative for rash.  Neurological:  Negative for dizziness, sensory change, loss of consciousness, weakness and headaches.  Endo/Heme/Allergies:  Negative for environmental allergies. Does not bruise/bleed easily.  Psychiatric/Behavioral:  Negative for depression and suicidal ideas. The patient is not nervous/anxious and does not have insomnia.       Objective:     BP 123/81   Pulse (!) 105  Ht 5\' 3"  (1.6 m)   Wt 195 lb 12.8 oz (88.8 kg)   LMP 06/13/2016   BMI 34.68 kg/m  BP Readings from Last 3 Encounters:  09/22/23 123/81  02/24/23 120/88  08/24/22 120/80   Wt Readings from Last 3 Encounters:  09/22/23 195 lb 12.8 oz (88.8 kg)  02/24/23 189 lb (85.7 kg)  08/24/22 191 lb (86.6 kg)   SpO2 Readings from Last 3 Encounters:  02/24/23 96%  08/24/22 96%  12/10/21 97%      Physical Exam Vitals and nursing note reviewed.  Constitutional:      General: She is not in acute distress.    Appearance: Normal appearance. She is well-developed.  HENT:     Head: Normocephalic and atraumatic.     Right Ear: Tympanic membrane, ear canal and external ear normal. There is no impacted cerumen.     Left Ear: Tympanic membrane, ear canal and external ear normal. There is no impacted cerumen.     Nose: Nose normal.     Mouth/Throat:     Mouth: Mucous membranes are moist.     Pharynx: Oropharynx is clear. No oropharyngeal exudate or posterior oropharyngeal erythema.  Eyes:     General: No scleral icterus.       Right eye: No discharge.        Left eye: No discharge.     Conjunctiva/sclera: Conjunctivae normal.     Pupils: Pupils are equal, round, and reactive to light.   Neck:     Thyroid: No thyromegaly or thyroid tenderness.     Vascular: No JVD.  Cardiovascular:     Rate and Rhythm: Normal rate and regular rhythm.     Heart sounds: Normal heart sounds. No murmur heard. Pulmonary:     Effort: Pulmonary effort is normal. No respiratory distress.     Breath sounds: Normal breath sounds.  Abdominal:     General: Bowel sounds are normal. There is no distension.     Palpations: Abdomen is soft. There is no mass.     Tenderness: There is no abdominal tenderness. There is no guarding or rebound.  Musculoskeletal:        General: Normal range of motion.     Cervical back: Normal range of motion and neck supple.     Right lower leg: No edema.     Left lower leg: No edema.  Lymphadenopathy:     Cervical: No cervical adenopathy.  Skin:    General: Skin is warm and dry.     Findings: No erythema or rash.  Neurological:     General: No focal deficit present.     Mental Status: She is alert and oriented to person, place, and time.     Cranial Nerves: No cranial nerve deficit.     Deep Tendon Reflexes: Reflexes are normal and symmetric.  Psychiatric:        Mood and Affect: Mood normal.        Behavior: Behavior normal.        Thought Content: Thought content normal.        Judgment: Judgment normal.      No results found for any visits on 09/22/23.  Last CBC Lab Results  Component Value Date   WBC 4.5 08/24/2022   HGB 14.4 08/24/2022   HCT 42.5 08/24/2022   MCV 88.6 08/24/2022   MCH 28.9 12/03/2016   RDW 13.5 08/24/2022   PLT 231.0 08/24/2022   Last metabolic panel Lab Results  Component Value Date   GLUCOSE 85 08/24/2022   NA 140 08/24/2022   K 4.7 08/24/2022   CL 101 08/24/2022   CO2 32 08/24/2022   BUN 22 08/24/2022   CREATININE 0.70 08/24/2022   GFR 95.84 08/24/2022   CALCIUM 10.1 08/24/2022   PROT 7.2 08/24/2022   ALBUMIN 4.4 08/24/2022   BILITOT 0.4 08/24/2022   ALKPHOS 87 08/24/2022   AST 22 08/24/2022   ALT 31  08/24/2022   Last lipids Lab Results  Component Value Date   CHOL 236 (H) 08/24/2022   HDL 63.80 08/24/2022   LDLCALC 71 12/03/2016   LDLDIRECT 132.0 08/24/2022   TRIG 232.0 (H) 08/24/2022   CHOLHDL 4 08/24/2022   Last hemoglobin A1c Lab Results  Component Value Date   HGBA1C 5.5 08/24/2022   Last thyroid functions Lab Results  Component Value Date   TSH 2.61 08/24/2022   Last vitamin D No results found for: "25OHVITD2", "25OHVITD3", "VD25OH" Last vitamin B12 and Folate Lab Results  Component Value Date   VITAMINB12 580 11/12/2013      The 10-year ASCVD risk score (Arnett DK, et al., 2019) is: 2.6%    Assessment & Plan:   Problem List Items Addressed This Visit       Unprioritized   Allergy history unknown   Relevant Medications   albuterol (VENTOLIN HFA) 108 (90 Base) MCG/ACT inhaler   montelukast (SINGULAIR) 10 MG tablet   Moderate single current episode of major depressive disorder (HCC)   Relevant Medications   buPROPion (WELLBUTRIN XL) 300 MG 24 hr tablet   DULoxetine (CYMBALTA) 20 MG capsule   Insomnia   Relevant Medications   zolpidem (AMBIEN) 5 MG tablet   Attention deficit disorder   Relevant Medications   methylphenidate (RITALIN) 10 MG tablet (Start on 10/22/2023)   methylphenidate (RITALIN) 10 MG tablet (Start on 11/22/2023)   Hx of cold sores   Depression   Relevant Medications   buPROPion (WELLBUTRIN XL) 300 MG 24 hr tablet   DULoxetine (CYMBALTA) 20 MG capsule   Asthma due to environmental allergies   Relevant Medications   albuterol (VENTOLIN HFA) 108 (90 Base) MCG/ACT inhaler   montelukast (SINGULAIR) 10 MG tablet   Preventative health care - Primary   [facility] UTD CHECK LABS  SEE AVS  Health Maintenance  Topic Date Due   HIV Screening  Never done   Hepatitis C Screening  Never done   Pneumococcal Vaccine 50-57 Years old (1 of 2 - PCV) Never done   MAMMOGRAM  12/26/2014   Cervical Cancer Screening (HPV/Pap Cotest)  10/27/2023  (Originally 01/17/2022)   Colonoscopy  09/21/2024 (Originally 12/25/2009)   COVID-19 Vaccine (6 - Moderna risk 2024-25 season) 09/28/2023   INFLUENZA VACCINE  01/13/2024   DTaP/Tdap/Td (3 - Td or Tdap) 03/29/2033   Zoster Vaccines- Shingrix  Completed   HPV VACCINES  Aged Out   Meningococcal B Vaccine  Aged Out         Other Visit Diagnoses       Attention deficit disorder (ADD) without hyperactivity       Relevant Medications   methylphenidate (RITALIN) 10 MG tablet (Start on 10/22/2023)   methylphenidate (RITALIN) 10 MG tablet (Start on 11/22/2023)     Menopause       Relevant Medications   estradiol (ESTRACE) 0.5 MG tablet   progesterone (PROMETRIUM) 200 MG capsule     Acne vulgaris       Relevant Medications   tazarotene (TAZORAC) 0.05 %  cream     Assessment and Plan Assessment & Plan Knee Pain   Chronic left knee pain with hyperextension and subluxation is present, accompanied by knee locking and pain upon unlocking. A new inner meniscus tear is suspected despite previous healing of an 80% meniscus tear. Financial constraints limit further evaluation. She is considering alternative exercises like a rebounder, though advised against it. Recumbent bike and elliptical are recommended for knee health. Consider referral to an orthopedic specialist for evaluation. Advise against high-impact exercises like a rebounder and recommend low-impact options such as a recumbent bike or elliptical.  Depression and Anxiety   She experiences ongoing depression and anxiety, worsened by political and economic concerns, feeling down, depressed, and hopeless most days. Current medications, Cymbalta and Ritalin, help manage symptoms. Continue Cymbalta and Ritalin, monitor mental health symptoms, and adjust treatment as necessary.  Hyperlipidemia   LDL cholesterol is elevated at 105 mg/dL with a family history of hyperlipidemia, while HDL cholesterol is at 66 mg/dL. She takes over-the-counter fish oil.  Weight loss could improve cholesterol levels. Discussed maintaining a healthy weight and continuing fish oil supplementation. Continue fish oil supplementation and encourage weight loss to improve cholesterol levels.  Carpal Tunnel Syndrome   Chronic carpal tunnel syndrome is present with constant symptom levels and no recent worsening reported.  Thumb Pain   Right thumb joint pain is possibly early arthritis, with a family history of severe thumb arthritis requiring surgery. Current symptoms do not require surgical intervention but monitoring is necessary.  General Health Maintenance   She is due for health maintenance screenings and vaccinations. Received MMR booster but not pneumonia vaccine. Pap smear is due. Considering pneumonia vaccination due to respiratory history. Discussed the importance of pneumonia vaccination given her history of respiratory infections. Recommend pneumonia vaccination at the pharmacy, schedule Pap smear, and discuss colonoscopy and mammogram screenings.  Follow-up   She is managing multiple chronic conditions and is due for routine follow-up. The importance of regular follow-ups was discussed for effective management. Refill all current medications including Ritalin and montelukast, perform a drug test as it is due, and schedule a follow-up appointment as needed.    No follow-ups on file.    Donato Schultz, DO

## 2023-09-23 NOTE — Addendum Note (Signed)
 Addended by: Thelma Barge D on: 09/23/2023 09:43 AM   Modules accepted: Orders

## 2023-09-25 LAB — DRUG MONITORING PANEL 376104, URINE
Amphetamines: NEGATIVE ng/mL (ref <500)
Barbiturates: NEGATIVE ng/mL (ref <300)
Benzodiazepines: NEGATIVE ng/mL (ref <100)
Cocaine Metabolite: NEGATIVE ng/mL (ref <150)
Desmethyltramadol: NEGATIVE ng/mL (ref <100)
Opiates: NEGATIVE ng/mL (ref <100)
Oxycodone: NEGATIVE ng/mL (ref <100)
Tramadol: NEGATIVE ng/mL (ref <100)

## 2023-09-25 LAB — DM TEMPLATE

## 2023-09-26 ENCOUNTER — Other Ambulatory Visit: Payer: Self-pay | Admitting: Family Medicine

## 2023-09-26 ENCOUNTER — Telehealth: Payer: Self-pay

## 2023-09-26 DIAGNOSIS — F988 Other specified behavioral and emotional disorders with onset usually occurring in childhood and adolescence: Secondary | ICD-10-CM

## 2023-09-26 MED ORDER — METHYLPHENIDATE HCL 10 MG PO TABS: ORAL_TABLET | ORAL | 0 refills | Status: DC

## 2023-09-26 NOTE — Telephone Encounter (Signed)
 Copied from CRM [id number]. Topic: Clinical - Prescription Issue >> Sep 26, 2023  2:17 PM Earnestine Goes B wrote: Reason for CRM: [facility] called to clarify  a prescription for methylphenidate (RITALIN) 10 MG tablet. They are calling to clarify the quantity of the medication states there were three prescriptions sent In with all different quantity. Please call Lovett Ruck, at [phone]

## 2023-09-27 NOTE — Telephone Encounter (Signed)
Rx updated and sent.

## 2023-09-28 ENCOUNTER — Telehealth: Payer: Self-pay | Admitting: Family Medicine

## 2023-09-28 NOTE — Telephone Encounter (Unsigned)
 Copied from CRM [id number]. Topic: Clinical - Prescription Issue >> Sep 28, 2023  4:59 PM Cindy Stephens wrote: Reason for CRM: Patient request meloxicam (MOBIC) 15 MG tablet for 90 days supply as it would be cheaper. Pt state can PCP write a new RX or contact prefer pharmacy

## 2023-09-30 ENCOUNTER — Other Ambulatory Visit: Payer: Self-pay | Admitting: Family Medicine

## 2023-10-03 MED ORDER — MELOXICAM 15 MG PO TABS: 15.0000 mg | ORAL_TABLET | Freq: Every day | ORAL | 0 refills | Status: DC

## 2023-10-26 ENCOUNTER — Other Ambulatory Visit: Payer: Self-pay | Admitting: Family Medicine

## 2023-10-26 ENCOUNTER — Encounter: Payer: Self-pay | Admitting: Family Medicine

## 2023-12-05 NOTE — Progress Notes (Unsigned)
 [facility] [address] [place] [address] Phone: [phone] Subjective:   LILLETTE Berwyn Posey, am serving as a scribe for Dr. Arthea Claudene.  I'm seeing this patient by the request  of:  Antonio Cyndee Jamee JONELLE, DO  CC: Right knee pain  YEP:Dlagzrupcz  Hartleigh R Melamed is a 58 y.o. female coming in with complaint of R knee pain. Last seen for carpal tunnel in 2023.  Past medical history significant for popliteal bursitis of the right knee as well as patellofemoral arthritis of the left knee.  Patient states 5 weeks ago patient had incident in which her R knee gave away on her while she was walking up steps. Pain was in back of knee and deep in the joint. Had to use a walker for 4-5 days afterwards. Had similar symptoms on L side in 2018. Tried to go for a walk one week later and this exacerbated her pain but pain is now more anterior.  Also c/o chronic pain in L knee from injury in 2018. Sharp pain over medial aspect.  Continues to take meloxicam , Cymbalta , and gabapentin       Past Medical History:  Diagnosis Date   Allergic rhinitis    Depression    GERD (gastroesophageal reflux disease)    Shoulder dislocation 2007   Left Shoulder   Urinary incontinence    Past Surgical History:  Procedure Laterality Date   APPENDECTOMY     ELBOW SURGERY Left 2007   elbow was shattered   Social History   Socioeconomic History   Marital status: Married    Spouse name: Not on file   Number of children: Not on file   Years of education: Not on file   Highest education level: Not on file  Occupational History   Not on file  Tobacco Use   Smoking status: Never   Smokeless tobacco: Never  Substance and Sexual Activity   Alcohol use: Yes    Alcohol/week: 0.0 standard drinks of alcohol    Comment: Occ   Drug use: No   Sexual activity: Yes    Partners: Male  Other Topics Concern   Not on file  Social History Narrative   Lives with husband and children  in a 2 story home.  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  Education: BS   Social Drivers of Health   Financial Resource Strain: Not on file  Food Insecurity: No Food Insecurity (08/24/2022)   Hunger Vital Sign    Worried About Running Out of Food in the Last Year: Never true    Ran Out of Food in the Last Year: Never true  Transportation Needs: Not on file  Physical Activity: Inactive (08/24/2022)   Exercise Vital Sign    Days of Exercise per Week: 0 days    Minutes of Exercise per Session: 0 min  Stress: Not on file  Social Connections: Not on file   Allergies  Allergen Reactions   Sulfa Antibiotics Hives and Nausea Only   Family History  Problem Relation Age of Onset   Heart disease Mother    Thyroid  disease Mother    Arthritis Mother    Arthritis Father    Hyperlipidemia Father    Alzheimer's disease Father    Hypertension Father        Paternal Family   Arthritis Sister    Osteoarthritis Brother    Diabetes Paternal Grandmother        PGGM   Arthritis Other  Heart disease Other        Pateternal Family   Stroke Other        Paternal Family    Current Outpatient Medications (Endocrine & Metabolic):    estradiol  (ESTRACE ) 0.5 MG tablet, Take 1 tablet (0.5 mg total) by mouth daily.   progesterone  (PROMETRIUM ) 200 MG capsule, 1  po at bedtime x 12 days q4 weeks   Current Outpatient Medications (Respiratory):    albuterol  (VENTOLIN  HFA) 108 (90 Base) MCG/ACT inhaler, Inhale 2 puffs into the lungs every 6 (six) hours as needed for wheezing or shortness of breath.   fluticasone  (FLONASE ) 50 MCG/ACT nasal spray, Place 2 sprays into both nostrils daily.   loratadine (CLARITIN) 10 MG tablet, Take 10 mg by mouth daily.   montelukast  (SINGULAIR ) 10 MG tablet, Take 1 tablet (10 mg total) by mouth at bedtime.   pseudoephedrine (SUDAFED) 30 MG tablet, Take 30 mg by mouth every 4 (four) hours as needed for congestion.  Current Outpatient Medications (Analgesics):    meloxicam   (MOBIC ) 15 MG tablet, TAKE ONE TABLET BY MOUTH ONCE A DAY  Current Outpatient Medications (Hematological):    vitamin B-12 (CYANOCOBALAMIN) 1000 MCG tablet, Take 1,000 mcg by mouth daily.  Current Outpatient Medications (Other):    buPROPion  (WELLBUTRIN  XL) 300 MG 24 hr tablet, Take 1 tablet (300 mg total) by mouth daily.   co-enzyme Q-10 30 MG capsule, Take 30 mg by mouth 3 (three) times daily.   diazepam  (VALIUM ) 5 MG tablet, Take 1 tablet (5 mg total) by mouth 3 (three) times daily as needed for anxiety.   DULoxetine  (CYMBALTA ) 20 MG capsule, Take 1 capsule (20 mg total) by mouth daily.   gabapentin  (NEURONTIN ) 400 MG capsule, Take 1 capsule (400 mg total) by mouth at bedtime.   glucosamine-chondroitin 500-400 MG tablet, Take 1 tablet by mouth 2 (two) times daily.   lidocaine  (XYLOCAINE ) 5 % ointment, Apply 1 application topically 3 (three) times daily as needed. Use gloves when applying to left leg.   magnesium oxide (MAG-OX) 400 MG tablet, Take 400 mg by mouth daily.   [place] Mushroom POWD, by Does not apply route.   methylphenidate  (RITALIN ) 10 MG tablet, 2 po bid   methylphenidate  (RITALIN ) 10 MG tablet, 2 po bid   methylphenidate  (RITALIN ) 10 MG tablet, 2 po bid   Misc Natural Products (PROGESTERONE  EX), Apply topically daily.   NIACIN CR PO, Take by mouth.   NONFORMULARY OR COMPOUNDED ITEM, Lipid, hep-- hyperlipidemia   NONFORMULARY OR COMPOUNDED ITEM, Cbcd, cmp, lipid, tsh----- dx complete physical exam   NONFORMULARY OR COMPOUNDED ITEM, Free t3 Free t4 Dx preventative health exam   Omega-3 Fatty Acids (FISH OIL) 1000 MG CAPS, Take 1 capsule by mouth.   tazarotene  (TAZORAC ) 0.05 % cream, Apply topically at bedtime.   Turmeric 500 MG CAPS, Take by mouth.   Tyrosine  500 MG CAPS, As directed   valACYclovir  (VALTREX ) 1000 MG tablet, 1 po qd   zolpidem  (AMBIEN ) 5 MG tablet, Take 1 tablet (5 mg total) by mouth at bedtime as needed for sleep.   Reviewed prior external information  including notes and imaging from  primary care provider As well as notes that were available from care everywhere and other healthcare systems.  Past medical history, social, surgical and family history all reviewed in electronic medical record.  No pertanent information unless stated regarding to the chief complaint.   Review of Systems:  No headache, visual changes, nausea, vomiting, diarrhea, constipation, dizziness,  abdominal pain, skin rash, fevers, chills, night sweats, weight loss, swollen lymph nodes, body aches, joint swelling, chest pain, shortness of breath, mood changes. POSITIVE muscle aches  Objective  Height 5' 3 (1.6 m), last menstrual period 06/13/2016.   General: No apparent distress alert and oriented x3 mood and affect normal, dressed appropriately.  HEENT: Pupils equal, extraocular movements intact  Respiratory: Patient's speak in full sentences and does not appear short of breath  Cardiovascular: No lower extremity edema, non tender, no erythema  Right knee exam shows patient does have some effusion noted.  Positive McMurray's.  Patient is painful over the medial aspect of the knee but does have full range of motion contralateral knee has some arthritis to mostly at the patellofemoral joint   Limited muscular skeletal ultrasound was performed and interpreted by CLAUDENE HUSSAR, M  Significant hypoechoic changes of the patellofemoral joint noted.  Large effusion noted.  Narrowing of the patellofemoral joint.  Fairly severe.  Patient though does have a medial meniscal tear that is noted.  Seems to be may be radial in nature with 5 to 10% of displacement noted. Impression: Right knee effusion with underlying arthritis and acute medial meniscal tear  After informed written and verbal consent, patient was seated on exam table. Right knee was prepped with alcohol swab and utilizing anterolateral approach, patient's right knee space was injected with 4:1  marcaine 0.5%: Kenalog  40mg /dL. Patient tolerated the procedure well without immediate complications.   Impression and Recommendations:    The above documentation has been reviewed and is accurate and complete Briselda Naval M Marlow Berenguer, DO

## 2023-12-08 ENCOUNTER — Encounter: Payer: Self-pay | Admitting: Family Medicine

## 2023-12-08 ENCOUNTER — Ambulatory Visit: Payer: Self-pay | Admitting: Family Medicine

## 2023-12-08 ENCOUNTER — Ambulatory Visit (INDEPENDENT_AMBULATORY_CARE_PROVIDER_SITE_OTHER): Payer: Self-pay

## 2023-12-08 ENCOUNTER — Other Ambulatory Visit: Payer: Self-pay

## 2023-12-08 VITALS — BP 130/80 | HR 91 | Ht 63.0 in

## 2023-12-08 DIAGNOSIS — S83241A Other tear of medial meniscus, current injury, right knee, initial encounter: Secondary | ICD-10-CM

## 2023-12-08 DIAGNOSIS — M25561 Pain in right knee: Secondary | ICD-10-CM

## 2023-12-08 NOTE — Assessment & Plan Note (Signed)
 Given injection today and tolerated the procedure well.  Acute onset of the medial meniscal tear with some displacement and a fairly large effusion of the patellofemoral joint.  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  Discussed icing regimen, home exercises, patient is taking meloxicam  fairly regularly.  Follow-up with me again 6 to 8 weeks.  At that time if no significant benefit I would consider the possibility of advanced imaging.

## 2023-12-08 NOTE — Patient Instructions (Addendum)
 Xray today Injection Exercises See me in 8-10 weeks

## 2023-12-09 ENCOUNTER — Ambulatory Visit: Payer: Self-pay | Admitting: Family Medicine

## 2023-12-12 ENCOUNTER — Ambulatory Visit: Payer: Self-pay | Admitting: Family Medicine

## 2024-01-25 NOTE — Progress Notes (Deleted)
 [facility] [address] [place] [address] Phone: [phone] Subjective:    I'm seeing this patient by the request  of:  Antonio Cyndee Jamee JONELLE, DO  CC:   YEP:Dlagzrupcz  12/08/2023 Given injection today and tolerated the procedure well.  Acute onset of the medial meniscal tear with some displacement and a fairly large effusion of the patellofemoral joint.  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  Discussed icing regimen, home exercises, patient is taking meloxicam fairly regularly.  Follow-up with me again 6 to 8 weeks.  At that time if no significant benefit I would consider the possibility of advanced imaging.     Updated 02/02/2024 Cindy Stephens is a 57 y.o. female coming in with complaint of R knee pain      Past Medical History:  Diagnosis Date   Allergic rhinitis    Depression    GERD (gastroesophageal reflux disease)    Shoulder dislocation 2007   Left Shoulder   Urinary incontinence    Past Surgical History:  Procedure Laterality Date   APPENDECTOMY     ELBOW SURGERY Left 2007   elbow was shattered   Social History   Socioeconomic History   Marital status: Married    Spouse name: Not on file   Number of children: Not on file   Years of education: Not on file   Highest education level: Not on file  Occupational History   Not on file  Tobacco Use   Smoking status: Never   Smokeless tobacco: Never  Substance and Sexual Activity   Alcohol use: Yes    Alcohol/week: 0.0 standard drinks of alcohol    Comment: Occ   Drug use: No   Sexual activity: Yes    Partners: Male  Other Topics Concern   Not on file  Social History Narrative   Lives with husband and children in a 2 story home.  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  Education: BS   Social Drivers of Health   Financial Resource Strain: Not on file  Food Insecurity: No Food Insecurity (08/24/2022)   Hunger Vital Sign     Worried About Running Out of Food in the Last Year: Never true    Ran Out of Food in the Last Year: Never true  Transportation Needs: Not on file  Physical Activity: Inactive (08/24/2022)   Exercise Vital Sign    Days of Exercise per Week: 0 days    Minutes of Exercise per Session: 0 min  Stress: Not on file  Social Connections: Not on file   Allergies  Allergen Reactions   Sulfa Antibiotics Hives and Nausea Only   Family History  Problem Relation Age of Onset   Heart disease Mother    Thyroid disease Mother    Arthritis Mother    Arthritis Father    Hyperlipidemia Father    Alzheimer's disease Father    Hypertension Father        Paternal Family   Arthritis Sister    Osteoarthritis Brother    Diabetes Paternal Grandmother        PGGM   Arthritis Other    Heart disease Other        Pateternal Family   Stroke Other        Paternal Family    Current Outpatient Medications (Endocrine & Metabolic):    estradiol (ESTRACE) 0.5 MG tablet, Take 1 tablet (0.5 mg total) by  mouth daily.   progesterone  (PROMETRIUM ) 200 MG capsule, 1  po at bedtime x 12 days q4 weeks   Current Outpatient Medications (Respiratory):    albuterol  (VENTOLIN  HFA) 108 (90 Base) MCG/ACT inhaler, Inhale 2 puffs into the lungs every 6 (six) hours as needed for wheezing or shortness of breath.   fluticasone  (FLONASE ) 50 MCG/ACT nasal spray, Place 2 sprays into both nostrils daily.   loratadine (CLARITIN) 10 MG tablet, Take 10 mg by mouth daily.   montelukast  (SINGULAIR ) 10 MG tablet, Take 1 tablet (10 mg total) by mouth at bedtime.   pseudoephedrine (SUDAFED) 30 MG tablet, Take 30 mg by mouth every 4 (four) hours as needed for congestion.  Current Outpatient Medications (Analgesics):    meloxicam  (MOBIC ) 15 MG tablet, TAKE ONE TABLET BY MOUTH ONCE A DAY  Current Outpatient Medications (Hematological):    vitamin B-12 (CYANOCOBALAMIN) 1000 MCG tablet, Take 1,000 mcg by mouth daily.  Current Outpatient  Medications (Other):    buPROPion  (WELLBUTRIN  XL) 300 MG 24 hr tablet, Take 1 tablet (300 mg total) by mouth daily.   co-enzyme Q-10 30 MG capsule, Take 30 mg by mouth 3 (three) times daily.   diazepam  (VALIUM ) 5 MG tablet, Take 1 tablet (5 mg total) by mouth 3 (three) times daily as needed for anxiety.   DULoxetine  (CYMBALTA ) 20 MG capsule, Take 1 capsule (20 mg total) by mouth daily.   gabapentin  (NEURONTIN ) 400 MG capsule, Take 1 capsule (400 mg total) by mouth at bedtime.   glucosamine-chondroitin 500-400 MG tablet, Take 1 tablet by mouth 2 (two) times daily.   lidocaine  (XYLOCAINE ) 5 % ointment, Apply 1 application topically 3 (three) times daily as needed. Use gloves when applying to left leg.   magnesium oxide (MAG-OX) 400 MG tablet, Take 400 mg by mouth daily.   [place] Mushroom POWD, by Does not apply route.   methylphenidate  (RITALIN ) 10 MG tablet, 2 po bid   methylphenidate  (RITALIN ) 10 MG tablet, 2 po bid   methylphenidate  (RITALIN ) 10 MG tablet, 2 po bid   Misc Natural Products (PROGESTERONE  EX), Apply topically daily.   NIACIN CR PO, Take by mouth.   NONFORMULARY OR COMPOUNDED ITEM, Lipid, hep-- hyperlipidemia   NONFORMULARY OR COMPOUNDED ITEM, Cbcd, cmp, lipid, tsh----- dx complete physical exam   NONFORMULARY OR COMPOUNDED ITEM, Free t3 Free t4 Dx preventative health exam   Omega-3 Fatty Acids (FISH OIL) 1000 MG CAPS, Take 1 capsule by mouth.   tazarotene  (TAZORAC ) 0.05 % cream, Apply topically at bedtime.   Turmeric 500 MG CAPS, Take by mouth.   Tyrosine  500 MG CAPS, As directed   valACYclovir  (VALTREX ) 1000 MG tablet, 1 po qd   zolpidem  (AMBIEN ) 5 MG tablet, Take 1 tablet (5 mg total) by mouth at bedtime as needed for sleep.   Reviewed prior external information including notes and imaging from  primary care provider As well as notes that were available from care everywhere and other healthcare systems.  Past medical history, social, surgical and family history all  reviewed in electronic medical record.  No pertanent information unless stated regarding to the chief complaint.   Review of Systems:  No headache, visual changes, nausea, vomiting, diarrhea, constipation, dizziness, abdominal pain, skin rash, fevers, chills, night sweats, weight loss, swollen lymph nodes, body aches, joint swelling, chest pain, shortness of breath, mood changes. POSITIVE muscle aches  Objective  Last menstrual period 06/13/2016.   General: No apparent distress alert and oriented x3 mood and affect normal, dressed  appropriately.  HEENT: Pupils equal, extraocular movements intact  Respiratory: Patient's speak in full sentences and does not appear short of breath  Cardiovascular: No lower extremity edema, non tender, no erythema      Impression and Recommendations:

## 2024-02-02 ENCOUNTER — Other Ambulatory Visit: Payer: Self-pay | Admitting: Family Medicine

## 2024-02-02 ENCOUNTER — Ambulatory Visit: Payer: Self-pay | Admitting: Family Medicine

## 2024-02-02 DIAGNOSIS — F988 Other specified behavioral and emotional disorders with onset usually occurring in childhood and adolescence: Secondary | ICD-10-CM

## 2024-02-03 NOTE — Telephone Encounter (Signed)
 Requesting: Ritalin  10mg   Contract: 09/23/23 UDS: 09/23/23 Last Visit: 09/22/23 Next Visit: None Last Refill: 11/22/23 #120 and 0RF   Please Advise

## 2024-04-03 ENCOUNTER — Other Ambulatory Visit: Payer: Self-pay | Admitting: Family Medicine

## 2024-04-03 DIAGNOSIS — Z789 Other specified health status: Secondary | ICD-10-CM

## 2024-04-03 DIAGNOSIS — Z780 Asymptomatic menopausal state: Secondary | ICD-10-CM

## 2024-04-03 DIAGNOSIS — F988 Other specified behavioral and emotional disorders with onset usually occurring in childhood and adolescence: Secondary | ICD-10-CM

## 2024-04-03 DIAGNOSIS — F321 Major depressive disorder, single episode, moderate: Secondary | ICD-10-CM

## 2024-04-03 MED ORDER — METHYLPHENIDATE HCL 10 MG PO TABS: 20.0000 mg | ORAL_TABLET | Freq: Two times a day (BID) | ORAL | 0 refills | Status: DC

## 2024-04-03 MED ORDER — ESTRADIOL 0.5 MG PO TABS: 0.5000 mg | ORAL_TABLET | Freq: Every day | ORAL | 0 refills | Status: DC

## 2024-04-03 NOTE — Telephone Encounter (Signed)
 Copied from CRM #[id number]. Topic: Clinical - Medication Refill >> Apr 03, 2024  9:04 AM Macario HERO wrote: Medication: methylphenidate  (RITALIN ) 10 MG tablet [[id number]] estradiol  (ESTRACE ) 0.5 MG tablet [[id number]]  Has the patient contacted their pharmacy? Yes (Agent: If no, request that the patient contact the pharmacy for the refill. If patient does not wish to contact the pharmacy document the reason why and proceed with request.) (Agent: If yes, when and what did the pharmacy advise?)  This is the patient's preferred pharmacy:  [facility] [address], [place] - [address] [place] [address] Phone: [phone] Fax: [phone]  Is this the correct pharmacy for this prescription? Yes If no, delete pharmacy and type the correct one.   Has the prescription been filled recently? Yes  Is the patient out of the medication? Yes  Has the patient been seen for an appointment in the last year OR does the patient have an upcoming appointment? Yes  Can we respond through MyChart? Yes  Agent: Please be advised that Rx refills may take up to 3 business days. We ask that you follow-up with your pharmacy.

## 2024-04-03 NOTE — Telephone Encounter (Signed)
 Requesting: methylphenidate  10mg   Contract:09/23/23 UDS:09/23/23 Last Visit: 09/22/23 Next Visit:04/10/24 Last Refill: 02/06/24 #120 and 0RF   Please Advise

## 2024-04-10 ENCOUNTER — Ambulatory Visit (INDEPENDENT_AMBULATORY_CARE_PROVIDER_SITE_OTHER): Payer: Self-pay | Admitting: Family Medicine

## 2024-04-10 ENCOUNTER — Encounter: Payer: Self-pay | Admitting: Family Medicine

## 2024-04-10 VITALS — BP 126/90 | HR 96 | Temp 98.5°F | Resp 18 | Ht 63.0 in | Wt 188.4 lb

## 2024-04-10 DIAGNOSIS — M1711 Unilateral primary osteoarthritis, right knee: Secondary | ICD-10-CM

## 2024-04-10 DIAGNOSIS — Z789 Other specified health status: Secondary | ICD-10-CM

## 2024-04-10 DIAGNOSIS — F909 Attention-deficit hyperactivity disorder, unspecified type: Secondary | ICD-10-CM

## 2024-04-10 DIAGNOSIS — F321 Major depressive disorder, single episode, moderate: Secondary | ICD-10-CM

## 2024-04-10 DIAGNOSIS — Z780 Asymptomatic menopausal state: Secondary | ICD-10-CM

## 2024-04-10 DIAGNOSIS — F988 Other specified behavioral and emotional disorders with onset usually occurring in childhood and adolescence: Secondary | ICD-10-CM

## 2024-04-10 DIAGNOSIS — L700 Acne vulgaris: Secondary | ICD-10-CM

## 2024-04-10 MED ORDER — METHYLPHENIDATE HCL 10 MG PO TABS: 20.0000 mg | ORAL_TABLET | Freq: Two times a day (BID) | ORAL | 0 refills | Status: AC

## 2024-04-10 MED ORDER — ESTRADIOL 1 MG PO TABS: 1.0000 mg | ORAL_TABLET | Freq: Every day | ORAL | 1 refills | Status: AC

## 2024-04-10 MED ORDER — TRETINOIN 0.05 % EX CREA: TOPICAL_CREAM | Freq: Every day | CUTANEOUS | 3 refills | Status: AC

## 2024-04-10 MED ORDER — DULOXETINE HCL 20 MG PO CPEP: 20.0000 mg | ORAL_CAPSULE | Freq: Every day | ORAL | 3 refills | Status: AC

## 2024-04-10 MED ORDER — BUPROPION HCL ER (XL) 300 MG PO TB24: 300.0000 mg | ORAL_TABLET | Freq: Every day | ORAL | 1 refills | Status: AC

## 2024-04-10 MED ORDER — MELOXICAM 15 MG PO TABS: 15.0000 mg | ORAL_TABLET | Freq: Every day | ORAL | 1 refills | Status: AC

## 2024-04-10 MED ORDER — PROGESTERONE 200 MG PO CAPS: ORAL_CAPSULE | ORAL | 3 refills | Status: AC

## 2024-04-10 MED ORDER — MONTELUKAST SODIUM 10 MG PO TABS: 10.0000 mg | ORAL_TABLET | Freq: Every day | ORAL | 11 refills | Status: AC

## 2024-04-10 NOTE — Progress Notes (Signed)
 Subjective:    Patient ID: Cindy Stephens, female    DOB: 1966/02/13, 58 y.o.   MRN: [id number]  Chief Complaint  Patient presents with   ADD   Follow-up    HPI Patient is in today for f/u add.  Discussed the use of AI scribe software for clinical note transcription with the patient, who gave verbal consent to proceed.  History of Present Illness Cindy Stephens is a 58 year old female who presents with ongoing knee pain following a fall in May.  In early May, she experienced a fall where her leg collapsed while ascending the garage steps, resulting in a torn meniscus. She consulted a physician in late June, who confirmed the diagnosis. She spent a month resting and icing the knee five to six times a day, and received an injection which provided some improvement. However, she describes the recovery as 'one step forward, two steps back'. She is now able to walk but struggles with prolonged standing or walking, which causes both legs to become tired and achy.  She has a history of a similar injury to the other leg in 2018, which eventually healed. She has been using a walker borrowed from a neighbor to aid mobility around the house. She has not returned for follow-up and is cautious about further injections due to her frequency limitations.  She is currently taking Cymbalta  (duloxetine ), meloxicam  at night, Singulair  for allergies, and estradiol . She has stopped taking gabapentin  due to concerns about dementia, as her father and grandparents had dementia. Her allergies have worsened this fall, likely due to mold exposure, which was confirmed by allergy testing 25 years ago. Singulair  has been effective in preventing sinus infections.  She is also taking progesterone  separately for 12 days each month. She is paying cash for her medications and inquires about the possibility of getting prescriptions for longer durations to reduce the frequency of refills.    Past Medical History:  Diagnosis  Date   Allergic rhinitis    Depression    GERD (gastroesophageal reflux disease)    Shoulder dislocation 2007   Left Shoulder   Urinary incontinence     Past Surgical History:  Procedure Laterality Date   APPENDECTOMY     ELBOW SURGERY Left 2007   elbow was shattered    Family History  Problem Relation Age of Onset   Heart disease Mother    Thyroid  disease Mother    Arthritis Mother    Arthritis Father    Hyperlipidemia Father    Alzheimer's disease Father    Hypertension Father        Paternal Family   Arthritis Sister    Osteoarthritis Brother    Diabetes Paternal Grandmother        PGGM   Arthritis Other    Heart disease Other        Pateternal Family   Stroke Other        Paternal Family    Social History   Socioeconomic History   Marital status: Married    Spouse name: Not on file   Number of children: Not on file   Years of education: Not on file   Highest education level: Not on file  Occupational History   Not on file  Tobacco Use   Smoking status: Never   Smokeless tobacco: Never  Substance and Sexual Activity   Alcohol use: Yes    Alcohol/week: 0.0 standard drinks of alcohol    Comment: Occ  Drug use: No   Sexual activity: Yes    Partners: Male  Other Topics Concern   Not on file  Social History Narrative   Lives with husband and children in a 2 story home.  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  Education: BS   Social Drivers of Health   Financial Resource Strain: Not on file  Food Insecurity: No Food Insecurity (08/24/2022)   Hunger Vital Sign    Worried About Running Out of Food in the Last Year: Never true    Ran Out of Food in the Last Year: Never true  Transportation Needs: Not on file  Physical Activity: Inactive (08/24/2022)   Exercise Vital Sign    Days of Exercise per Week: 0 days    Minutes of Exercise per Session: 0 min  Stress: Not on file  Social Connections: Not on file  Intimate Partner Violence: Not on file     Outpatient Medications Prior to Visit  Medication Sig Dispense Refill   albuterol  (VENTOLIN  HFA) 108 (90 Base) MCG/ACT inhaler Inhale 2 puffs into the lungs every 6 (six) hours as needed for wheezing or shortness of breath. 8 g 0   buPROPion  (WELLBUTRIN  XL) 300 MG 24 hr tablet Take 1 tablet (300 mg total) by mouth daily. 90 tablet 0   co-enzyme Q-10 30 MG capsule Take 30 mg by mouth 3 (three) times daily.     diazepam  (VALIUM ) 5 MG tablet Take 1 tablet (5 mg total) by mouth 3 (three) times daily as needed for anxiety. 30 tablet 0   fluticasone  (FLONASE ) 50 MCG/ACT nasal spray Place 2 sprays into both nostrils daily. 16 g 2   glucosamine-chondroitin 500-400 MG tablet Take 1 tablet by mouth 2 (two) times daily.     lidocaine  (XYLOCAINE ) 5 % ointment Apply 1 application topically 3 (three) times daily as needed. Use gloves when applying to left leg. 35.44 g 3   loratadine (CLARITIN) 10 MG tablet Take 10 mg by mouth daily.     magnesium oxide (MAG-OX) 400 MG tablet Take 400 mg by mouth daily.     [place] Mushroom POWD by Does not apply route.     Misc Natural Products (PROGESTERONE  EX) Apply topically daily.     NIACIN CR PO Take by mouth.     NONFORMULARY OR COMPOUNDED ITEM Lipid, hep-- hyperlipidemia 1 each 0   NONFORMULARY OR COMPOUNDED ITEM Cbcd, cmp, lipid, tsh----- dx complete physical exam 1 each 0   NONFORMULARY OR COMPOUNDED ITEM Free t3 Free t4 Dx preventative health exam 1 each 0   Omega-3 Fatty Acids (FISH OIL) 1000 MG CAPS Take 1 capsule by mouth.     progesterone  (PROMETRIUM ) 200 MG capsule 1  po at bedtime x 12 days q4 weeks 36 capsule 3   Turmeric 500 MG CAPS Take by mouth.     Tyrosine  500 MG CAPS As directed  0   valACYclovir  (VALTREX ) 1000 MG tablet 1 po qd 30 tablet 2   vitamin B-12 (CYANOCOBALAMIN) 1000 MCG tablet Take 1,000 mcg by mouth daily.     zolpidem  (AMBIEN ) 5 MG tablet Take 1 tablet (5 mg total) by mouth at bedtime as needed for sleep. 30 tablet 1    DULoxetine  (CYMBALTA ) 20 MG capsule Take 1 capsule (20 mg total) by mouth daily. 90 capsule 0   estradiol  (ESTRACE ) 0.5 MG tablet Take 1 tablet (0.5 mg total) by mouth daily. 90 tablet 0   gabapentin  (NEURONTIN ) 400 MG capsule Take 1 capsule (  400 mg total) by mouth at bedtime. 90 capsule 0   meloxicam  (MOBIC ) 15 MG tablet TAKE ONE TABLET BY MOUTH ONCE A DAY 90 tablet 0   methylphenidate  (RITALIN ) 10 MG tablet Take 2 tablets (20 mg total) by mouth 2 (two) times daily. 120 tablet 0   montelukast  (SINGULAIR ) 10 MG tablet Take 1 tablet (10 mg total) by mouth at bedtime. 90 tablet 0   pseudoephedrine (SUDAFED) 30 MG tablet Take 30 mg by mouth every 4 (four) hours as needed for congestion.     tazarotene  (TAZORAC ) 0.05 % cream Apply topically at bedtime. 30 g 5   No facility-administered medications prior to visit.    Allergies  Allergen Reactions   Sulfa Antibiotics Hives and Nausea Only    Review of Systems  Constitutional:  Negative for chills, fever and malaise/fatigue.  HENT:  Negative for congestion and hearing loss.   Eyes:  Negative for blurred vision and discharge.  Respiratory:  Negative for cough, sputum production and shortness of breath.   Cardiovascular:  Negative for chest pain, palpitations and leg swelling.  Gastrointestinal:  Negative for abdominal pain, blood in stool, constipation, diarrhea, heartburn, nausea and vomiting.  Genitourinary:  Negative for dysuria, frequency, hematuria and urgency.  Musculoskeletal:  Negative for back pain, falls and myalgias.  Skin:  Negative for rash.  Neurological:  Negative for dizziness, sensory change, loss of consciousness, weakness and headaches.  Endo/Heme/Allergies:  Negative for environmental allergies. Does not bruise/bleed easily.  Psychiatric/Behavioral:  Negative for depression and suicidal ideas. The patient is not nervous/anxious and does not have insomnia.        Objective:    Physical Exam Vitals and nursing note  reviewed.  Constitutional:      General: She is not in acute distress.    Appearance: Normal appearance. She is well-developed.  HENT:     Head: Normocephalic and atraumatic.  Eyes:     General: No scleral icterus.       Right eye: No discharge.        Left eye: No discharge.  Cardiovascular:     Rate and Rhythm: Normal rate and regular rhythm.     Heart sounds: No murmur heard. Pulmonary:     Effort: Pulmonary effort is normal. No respiratory distress.     Breath sounds: Normal breath sounds.  Musculoskeletal:        General: Normal range of motion.     Cervical back: Normal range of motion and neck supple.     Right lower leg: No edema.     Left lower leg: No edema.  Skin:    General: Skin is warm and dry.  Neurological:     Mental Status: She is alert and oriented to person, place, and time.  Psychiatric:        Mood and Affect: Mood normal.        Behavior: Behavior normal.        Thought Content: Thought content normal.        Judgment: Judgment normal.     BP (!) 126/90 (BP Location: Left Arm, Patient Position: Sitting, Cuff Size: Normal)   Pulse 96   Temp 98.5 F (36.9 C) (Oral)   Resp 18   Ht 5' 3 (1.6 m)   Wt 188 lb 6.4 oz (85.5 kg)   LMP 06/13/2016   SpO2 96%   BMI 33.37 kg/m  Wt Readings from Last 3 Encounters:  04/10/24 188 lb 6.4 oz (85.5 kg)  09/22/23  195 lb 12.8 oz (88.8 kg)  02/24/23 189 lb (85.7 kg)    Diabetic Foot Exam - Simple   No data filed    Lab Results  Component Value Date   WBC 4.5 08/24/2022   HGB 14.4 08/24/2022   HCT 42.5 08/24/2022   PLT 231.0 08/24/2022   GLUCOSE 85 08/24/2022   CHOL 236 (H) 08/24/2022   TRIG 232.0 (H) 08/24/2022   HDL 63.80 08/24/2022   LDLDIRECT 132.0 08/24/2022   LDLCALC 71 12/03/2016   ALT 31 08/24/2022   AST 22 08/24/2022   NA 140 08/24/2022   K 4.7 08/24/2022   CL 101 08/24/2022   CREATININE 0.70 08/24/2022   BUN 22 08/24/2022   CO2 32 08/24/2022   TSH 2.61 08/24/2022   HGBA1C 5.5  08/24/2022    Lab Results  Component Value Date   TSH 2.61 08/24/2022   Lab Results  Component Value Date   WBC 4.5 08/24/2022   HGB 14.4 08/24/2022   HCT 42.5 08/24/2022   MCV 88.6 08/24/2022   PLT 231.0 08/24/2022   Lab Results  Component Value Date   NA 140 08/24/2022   K 4.7 08/24/2022   CO2 32 08/24/2022   GLUCOSE 85 08/24/2022   BUN 22 08/24/2022   CREATININE 0.70 08/24/2022   BILITOT 0.4 08/24/2022   ALKPHOS 87 08/24/2022   AST 22 08/24/2022   ALT 31 08/24/2022   PROT 7.2 08/24/2022   ALBUMIN 4.4 08/24/2022   CALCIUM 10.1 08/24/2022   GFR 95.84 08/24/2022   Lab Results  Component Value Date   CHOL 236 (H) 08/24/2022   Lab Results  Component Value Date   HDL 63.80 08/24/2022   Lab Results  Component Value Date   LDLCALC 71 12/03/2016   Lab Results  Component Value Date   TRIG 232.0 (H) 08/24/2022   Lab Results  Component Value Date   CHOLHDL 4 08/24/2022   Lab Results  Component Value Date   HGBA1C 5.5 08/24/2022       Assessment & Plan:  Attention deficit hyperactivity disorder (ADHD), unspecified ADHD type  Attention deficit disorder (ADD) without hyperactivity -     Methylphenidate  HCl; Take 2 tablets (20 mg total) by mouth 2 (two) times daily.  Dispense: 120 tablet; Refill: 0  Allergy history unknown -     Montelukast  Sodium; Take 1 tablet (10 mg total) by mouth at bedtime.  Dispense: 90 tablet; Refill: 11  Moderate single current episode of major depressive disorder (HCC) -     DULoxetine  HCl; Take 1 capsule (20 mg total) by mouth daily.  Dispense: 90 capsule; Refill: 3  Primary osteoarthritis of right knee -     Meloxicam ; Take 1 tablet (15 mg total) by mouth daily.  Dispense: 90 tablet; Refill: 1  Menopause -     Estradiol ; Take 1 tablet (1 mg total) by mouth daily.  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 Improvement has been inconsistent despite rest, icing, and an injection. She is hesitant about MRI or surgery due to cost and prefers conservative management. Encourage strengthening exercises, including resistance bands and walking sideways with a band, and emphasize strengthening pelvic floor and core muscles. Discuss potential surgery if symptoms do not improve by spring.  Menopausal state  with hormone therapy   Currently on hormone therapy with estradiol  0.5 mg and progesterone , she experiences menopausal symptoms and wishes to increase estradiol  to 1 mg due to joint pain. The provider is cautious about increasing the dose and suggests consulting a gynecologist for higher doses. Increase estradiol  to 1 mg and monitor symptoms. Consider referral to a gynecologist for further management and discuss vaginal estrogen for localized symptoms.  Major depressive disorder   She manages her major depressive disorder with Cymbalta  (duloxetine ) and has stopped gabapentin  due to dementia risk concerns, reporting she is doing okay without it. Continue Cymbalta  as prescribed and monitor mental health status, adjusting treatment as necessary.  Attention-deficit hyperactivity disorder   Her ADHD is well-managed with Ritalin , and the medication contract was renewed in April. Continue Ritalin  as prescribed.  Allergic rhinitis due to mold   She experiences allergic rhinitis primarily due to mold exposure, which worsened this fall. Singulair  (montelukast ) effectively prevents sinus infections. Continue Singulair  as prescribed and monitor allergy symptoms, adjusting treatment if necessary.  General Health Maintenance   She is interested in receiving flu and COVID vaccinations and is aware of age-related guidelines for COVID vaccination. Advise obtaining flu and COVID vaccinations at the pharmacy.    Eriana Suliman R  Lowne Chase, DO

## 2024-04-10 NOTE — Patient Instructions (Signed)
 Living With Attention Deficit Hyperactivity Disorder If you have been diagnosed with attention deficit hyperactivity disorder (ADHD), you may be relieved that you now know why you have felt or behaved a certain way. Still, you may feel overwhelmed about the treatment ahead. You may also wonder how to get the support you need and how to deal with the condition day-to-day. With treatment and support, you can live with ADHD and manage your symptoms. How to manage lifestyle changes Managing lifestyle changes can be challenging. Seeking support from your healthcare provider, therapist, family, and friends can be helpful. How to recognize changes in your condition The following signs may mean that your treatment is working well and your condition is improving: Consistently being on time for appointments. Being more organized at home and work. Other people noticing improvements in your behavior. Achieving goals that you set for yourself. Thinking more clearly. The following signs may mean that your treatment is not working very well: Feeling impatience or more confusion. Missing, forgetting, or being late for appointments. An increasing sense of disorganization and messiness. More difficulty in reaching goals that you set for yourself. Loved ones becoming angry or frustrated with you. Follow these instructions at home: Medicines Take over-the-counter and prescription medicines only as told by your health care provider. Check with your health care provider before taking any new medicines. General instructions Create structure and an organized atmosphere at home. For example: Make a list of tasks, then rank them from most important to least important. Work on one task at a time until your listed tasks are done. Make a daily schedule and follow it consistently every day. Use an appointment calendar, and check it 2-3 times a day to keep on track. Keep it with you when you leave the house. Create  spaces where you keep certain things, and always put things back in their places after you use them. Keep all follow-up visits. Your health care provider will need to monitor your condition and adjust your treatment over time. Where to find support Talking to others  Keep emotion out of important discussions and speak in a calm, logical way. Listen closely and patiently to your loved ones. Try to understand their point of view, and try to avoid getting defensive. Take responsibility for the consequences of your actions. Ask that others do not take your behaviors personally. Aim to solve problems as they come up, and express your feelings instead of bottling them up. Talk openly about what you need from your loved ones and how they can support you. Consider going to family therapy sessions or having your family meet with a specialist who deals with ADHD-related behavior problems. Finances Not all insurance plans cover mental health care, so it is important to check with your insurance carrier. If paying for co-pays or counseling services is a problem, search for a local or county mental health care center. Public mental health care services may be offered there at a low cost or no cost when you are not able to see a private health care provider. If you are taking medicine for ADHD, you may be able to get the generic form, which may be less expensive than brand-name medicine. Some makers of prescription medicines also offer help to patients who cannot afford the medicines that they need. Therapy and support groups Talking with a mental health care provider and participating in support groups can help to improve your quality of life, daily functioning, and overall symptoms. Questions to ask your health  care provider: What are the risks and benefits of taking medicines? Would I benefit from therapy? How often should I follow up with a health care provider? Where to find more information Learn more  about ADHD from: Children and Adults with Attention Deficit Hyperactivity Disorder: [employer] of Mental Health: BloggerCourse.com Centers for Disease Control and Prevention: TonerPromos.no Contact a health care provider if: You have side effects from your medicines, such as: Repeated muscle twitches, coughing, or speech outbursts. Sleep problems. Loss of appetite. Dizziness. Unusually fast heartbeat. Stomach pains. Headaches. You have new or worsening behavior problems. You are struggling with anxiety, depression, or substance abuse. Get help right away if: You have a severe reaction to a medicine. These symptoms may be an emergency. Get help right away. Call 911. Do not wait to see if the symptoms will go away. Do not drive yourself to the hospital. Take one of these steps if you feel like you may hurt yourself or others, or have thoughts about taking your own life: Go to your nearest emergency room. Call 911. Call the National Suicide Prevention Lifeline at 662 550 4067 or 988. This is open 24 hours a day. Text the Crisis Text Line at 430-382-2444. Summary With treatment and support, you can live with ADHD and manage your symptoms. Consider taking part in family therapy or self-help groups with family members or friends. When you talk with friends and family about your ADHD, be patient and communicate openly. Keep all follow-up visits. Your health care provider will need to monitor your condition and adjust your treatment over time. This information is not intended to replace advice given to you by your health care provider. Make sure you discuss any questions you have with your health care provider. Document Revised: 09/18/2021 Document Reviewed: 09/18/2021 Elsevier Patient Education  2024 [employer].
